# Patient Record
Sex: Male | Born: 1950
Health system: Southern US, Community
[De-identification: ages and names within clinical notes are randomized; demographics above are authoritative.]

## PROBLEM LIST (undated history)

## (undated) DIAGNOSIS — M1990 Unspecified osteoarthritis, unspecified site: Secondary | ICD-10-CM

## (undated) DIAGNOSIS — Z860100 Personal history of colon polyps, unspecified: Secondary | ICD-10-CM

## (undated) DIAGNOSIS — Z87442 Personal history of urinary calculi: Secondary | ICD-10-CM

## (undated) DIAGNOSIS — D126 Benign neoplasm of colon, unspecified: Secondary | ICD-10-CM

## (undated) DIAGNOSIS — I739 Peripheral vascular disease, unspecified: Secondary | ICD-10-CM

## (undated) DIAGNOSIS — E039 Hypothyroidism, unspecified: Secondary | ICD-10-CM

## (undated) DIAGNOSIS — I10 Essential (primary) hypertension: Secondary | ICD-10-CM

## (undated) DIAGNOSIS — I63239 Cerebral infarction due to unspecified occlusion or stenosis of unspecified carotid arteries: Secondary | ICD-10-CM

## (undated) DIAGNOSIS — E079 Disorder of thyroid, unspecified: Secondary | ICD-10-CM

## (undated) DIAGNOSIS — I639 Cerebral infarction, unspecified: Secondary | ICD-10-CM

## (undated) DIAGNOSIS — T8859XA Other complications of anesthesia, initial encounter: Secondary | ICD-10-CM

## (undated) DIAGNOSIS — E785 Hyperlipidemia, unspecified: Secondary | ICD-10-CM

## (undated) DIAGNOSIS — K219 Gastro-esophageal reflux disease without esophagitis: Secondary | ICD-10-CM

## (undated) DIAGNOSIS — Z8371 Family history of colonic polyps: Secondary | ICD-10-CM

## (undated) DIAGNOSIS — Z83719 Family history of colon polyps, unspecified: Secondary | ICD-10-CM

## (undated) HISTORY — PX: COLONOSCOPY: SHX174

## (undated) HISTORY — DX: Family history of colon polyps, unspecified: Z83.719

## (undated) HISTORY — DX: Hyperlipidemia, unspecified: E78.5

## (undated) HISTORY — PX: CAROTID ENDARTERECTOMY: SUR193

## (undated) HISTORY — DX: Essential (primary) hypertension: I10

## (undated) HISTORY — PX: TONSILLECTOMY: SUR1361

## (undated) HISTORY — PX: EYE SURGERY: SHX253

## (undated) HISTORY — PX: APPENDECTOMY: SHX54

## (undated) HISTORY — DX: Disorder of thyroid, unspecified: E07.9

## (undated) HISTORY — PX: CATARACT EXTRACTION, BILATERAL: SHX1313

## (undated) HISTORY — DX: Personal history of colon polyps, unspecified: Z86.0100

## (undated) HISTORY — DX: Personal history of colonic polyps: Z86.010

## (undated) HISTORY — DX: Family history of colonic polyps: Z83.71

---

## 2006-11-07 ENCOUNTER — Ambulatory Visit: Payer: Self-pay | Admitting: Vascular Surgery

## 2006-11-09 ENCOUNTER — Ambulatory Visit: Payer: Self-pay | Admitting: Vascular Surgery

## 2006-11-09 ENCOUNTER — Encounter (INDEPENDENT_AMBULATORY_CARE_PROVIDER_SITE_OTHER): Payer: Self-pay | Admitting: *Deleted

## 2006-11-09 ENCOUNTER — Inpatient Hospital Stay (HOSPITAL_COMMUNITY): Admission: RE | Admit: 2006-11-09 | Discharge: 2006-11-10 | Payer: Self-pay | Admitting: Vascular Surgery

## 2006-11-21 ENCOUNTER — Ambulatory Visit: Payer: Self-pay | Admitting: Vascular Surgery

## 2007-06-09 ENCOUNTER — Ambulatory Visit: Payer: Self-pay | Admitting: Vascular Surgery

## 2007-12-08 ENCOUNTER — Ambulatory Visit: Payer: Self-pay | Admitting: Vascular Surgery

## 2011-01-19 NOTE — Procedures (Signed)
CAROTID DUPLEX EXAM   INDICATION:  Follow up known carotid artery disease.   HISTORY:  Diabetes:  No.  Cardiac:  Hypertension:  Yes.  Smoking:  Yes.  Previous Surgery:  Right carotid end arterectomy with Dacron patch by  Dr. Arbie Cookey on 11/10/06.  CV History:  Patient is complaining of transient right arm weakness and  numbness.  Amaurosis Fugax No, Paresthesias Yes, Hemiparesis Yes.                                       RIGHT             LEFT  Brachial systolic pressure:         124               120  Brachial Doppler waveforms:         Biphasic          Biphasic  Vertebral direction of flow:        Antegrade         Antegrade  DUPLEX VELOCITIES (cm/sec)  CCA peak systolic                   76                46  ECA peak systolic                   81                132  ICA peak systolic                   51                Occluded  ICA end diastolic                   23                Occluded  PLAQUE MORPHOLOGY:                  None              Soft  PLAQUE AMOUNT:                      None              Severe  PLAQUE LOCATION:                    None              Throughout the ICA   IMPRESSION:  1. Normal carotid duplex noted in the right internal carotid artery,      status post right carotid end arterectomy.  2. Occluded left internal carotid artery.  3. Bilateral antegrade vertebral flow.   Patient did not want to see the doctor, even though he was symptomatic.   ___________________________________________  Larina Earthly, M.D.   MG/MEDQ  D:  06/09/2007  T:  06/10/2007  Job:  [id number]

## 2011-01-19 NOTE — Procedures (Signed)
CAROTID DUPLEX EXAM   INDICATION:  Followup, carotid artery disease.   HISTORY:  Diabetes:  No.  Cardiac:  Enlarged ventricle.  Hypertension:  Yes.  Smoking:  Occasionally.  Previous Surgery:  Right CEA with DPA by Dr. Arbie Cookey, 11/10/06.  CV History:  Intermittent right arm and leg weakness and numbness.  Amaurosis Fugax No, Paresthesias Yes, Hemiparesis Yes  Known occluded left ICA.                                       RIGHT             LEFT  Brachial systolic pressure:         150               145  Brachial Doppler waveforms:         Biphasic          Biphasic  Vertebral direction of flow:        Antegrade         Antegrade  DUPLEX VELOCITIES (cm/sec)  CCA peak systolic                   99                71  ECA peak systolic                   243               219  ICA peak systolic                   94                Occluded  ICA end diastolic                   43                Occluded  PLAQUE MORPHOLOGY:                  None in ICA       Soft  PLAQUE AMOUNT:                      None in ICA       Severe  PLAQUE LOCATION:                    ECA               ICA   IMPRESSION:  1. No evidence of restenosis in right internal carotid artery, status      post carotid endarterectomy.  2. Known occluded left internal carotid artery.  3. Right external carotid artery stenosis.  4. Left external carotid artery velocity is elevated; however, may be      due to occluded left internal carotid artery.   ___________________________________________  Larina Earthly, M.D.   AS/MEDQ  D:  12/08/2007  T:  12/08/2007  Job:  [id number]

## 2011-01-22 NOTE — H&P (Signed)
NAME:  Harold Harris, Harold Harris NO.:  [id number]   MEDICAL RECORD NO.:  [id number]          PATIENT TYPE:  INP   LOCATION:  NA                           FACILITY:  [facility]   PHYSICIAN:  Larina Earthly, M.D.    DATE OF BIRTH:  [birth year]   DATE OF ADMISSION:  11/09/2006  DATE OF DISCHARGE:                              HISTORY & PHYSICAL   ADMISSION DIAGNOSIS:  Severe symptomatic right internal carotid artery  stenosis.   HISTORY OF PRESENT ILLNESS:  The patient is a 60 year old gentleman who  over the past several months has had several different events of  neurologic deficits.  He has had several episodes of right hand numbness  and clumsiness and two weeks ago had an episode of total right-sided  numbness and weakness making it impossible for him to stand, this lasted  for several minutes and completely resolved.  He also has had at least  one episode of very clear-cut right eye amaurosis fugax approximately  one month ago.  He has had no neurological deficits for the past three  weeks.  He also has had occasional headache associated with this.  He  currently is not having any neurologic symptoms and is back at his  baseline.   PAST MEDICAL HISTORY:  Negative for cardiac disease, negative for  hypertension, he has recently been diagnosed with elevated cholesterol.  He does not have any history of premature atherosclerotic disease in his  family.  He is a married father of one, works as a Curator.  He does  smoke one pack of cigarettes per day and does not drink alcohol on a  regular basis.   REVIEW OF SYSTEMS:  Positive only for the neurologic events discussed  above.   ALLERGIES:  No known drug allergies.   MEDICATIONS:  Aspirin 325 mg a day which has been started over the past  two months with these symptoms.   PHYSICAL EXAMINATION:  GENERAL:  He is a well-developed, well-nourished  white male appearing stated age of 4.  VITALS:  Blood pressure is 138/80 in his  left arm, 140/80 in his right  arm, pulse 72, respirations 18.  CAROTIDS:  His carotid arteries reveal a harsh right carotid bruit and a  soft left carotid bruit.  NEURO:  He is grossly intact neurologically.  EXTREMITIES:  His radial and femoral pulses  are 2+ bilaterally.  He has  2+ left posterior tibial pulse and 1+ right dorsalis pedis pulse.  ABDOMINAL EXAM:  Reveals no aneurysm palpable.  HEART:  Regular rate and rhythm.  CHEST:  Clear.   LABORATORY DATA:  He did have a carotid duplex evaluation revealing  occlusion of his left internal carotid artery and severe stenosis in his  right internal carotid artery.   IMPRESSION:  Severe extracranial cerebrovascular occlusive disease.   PLAN:  The patient will be admitted on March 5 for a right carotid  endarterectomy for reduction of stroke risk.  I discussed with Mr.  Harris that his complete occlusion of his left carotid was not fixable,  but that he does have  adequate collateral cross field from right to left.  I explained the  procedure including the 1 to 2% risk of stroke with surgery and possible  low risk for cranial nerve injury, he understands and will proceed with  right carotid endarterectomy on March 5.      Larina Earthly, M.D.  Electronically Signed     TFE/MEDQ  D:  11/07/2006  T:  11/07/2006  Job:  [id number]

## 2011-01-22 NOTE — Op Note (Signed)
NAME:  Harold Harris, Harold Harris NO.:  [id number]   MEDICAL RECORD NO.:  [id number]          PATIENT TYPE:  INP   LOCATION:  2550                         FACILITY:  [facility]   PHYSICIAN:  Larina Earthly, M.D.    DATE OF BIRTH:  April 12, 1951   DATE OF PROCEDURE:  11/09/2006  DATE OF DISCHARGE:                               OPERATIVE REPORT   PREOPERATIVE DIAGNOSIS:  Severe symptomatic right internal carotid  artery stenosis.   POSTOPERATIVE DIAGNOSIS:  Severe symptomatic right internal carotid  artery stenosis.   PROCEDURE:  Right carotid endarterectomy and Dacron patch angioplasty.   SURGEON:  Dr. Tawanna Cooler Early.   ASSISTANT:  Dr. Jerilee Field and Gershon Crane, P.A.-C.   ANESTHESIA:  General endotracheal.   COMPLICATIONS:  None.   DISPOSITION:  To recovery room, neurologically intact.   PROCEDURE IN DETAIL:  The patient was taken to the operating room and  placed in the supine position, where the area of the right neck was  prepped and draped in the usual sterile fashion.  An incision was made  in the anterior sternocleidomastoid and carried down to the platysma  with electrocautery.  The sternocleidomastoid was reflected posteriorly  and the carotid sheath was opened.  The fascial vein was ligated with 3-  0 silk ties and divided.  The common carotid artery was encircled with  umbilical tape Rumel tourniquet.  The vagus nerve was identified and  preserved.  Dissection was carried onto the bifurcation.  The superior  thyroid artery was encircled with a 2-0 silk Potts tie, and the external  carotid was encircled with a red vessel loop, and the internal carotid  artery was circled with umbilical tape and Rumel tourniquet.  The  hypoglossal nerve was identified and preserved.  The patient was given  8,000 units for intravenous heparin effect.  Circulation time of the  internal, external and common arteries were occluded.  The common  carotid artery was opened with an 11 blade  and extended longitudinally  with Potts scissors through the plaque onto the internal carotid.  A 10  shunt was passed up into the internal carotid, allowed to back bleed and  down the common carotid artery and the area is secured with Rumel  tourniquets.  Then endarterectomy is begun on the common carotid artery  and the plaque was divided proximally with Potts scissors.  The  endarterectomy was carried on to the bifurcation.  The external carotid  was endarterectomized with the eversion technique and the internal  carotid was endarterectomized in an open fashion.  The remaining  atheromatous debris was removed from the endarterectomy plane.  A  Finesse Hemashield Dacron patch was brought into the field and was sewn  as a patch angioplasty with a running 6-0 Prolene suture.  Prior to  completion of the anastomosis, the usual flushing maneuvers were  undertaken and anastomosis was then completed.  Flow was restored first  to the external and then the internal carotid artery.  Excellent flow  characteristics were noted with handheld Doppler in the internal and  external carotid arteries.  The patient was given 50 mg of protamine to  reverse the heparin.  The wounds were irrigated with saline.  Hemostasis  seen at the cautery wounds were closed with several 3-0 Vicryl sutures  to reapproximate the  sternocleidomastoid over the carotid sheath.  Next the platysma was  closed with running 3-0 Vicryl suture and finally the skin was closed  with a 4-0 subcuticular Vicryl stitch.  A sterile dressing was applied  and the patient was awakened, neurologically intact in the operating  room, and was transferred to the recovery room in stable condition.      Larina Earthly, M.D.  Electronically Signed     TFE/MEDQ  D:  11/09/2006  T:  11/09/2006  Job:  [id number]

## 2011-01-22 NOTE — Discharge Summary (Signed)
NAME:  Harold Harris, Harold Harris NO.:  [id number]   MEDICAL RECORD NO.:  [id number]          PATIENT TYPE:  INP   LOCATION:  3311                         FACILITY:  [facility]   PHYSICIAN:  Rowe Clack, P.A.-C. DATE OF BIRTH:  31-Dec-1950   DATE OF ADMISSION:  11/09/2006  DATE OF DISCHARGE:  11/10/2006                               DISCHARGE SUMMARY   HISTORY OF PRESENT ILLNESS:  The patient is a 60 year old gentleman who  over the past several months has had several different events of  neurological deficits.  He has had several episodes of right hand  numbness and clumsiness, and two weeks ago he had an episode of a total  right-sided numbness and weakness making it impossible for him to stand.  This lasted for several minutes and then completely resolved.  He also  had at least one episode of very clear-cut right eye amaurosis fugax  approximately one month ago.  He has had no neurological deficits for  the past three weeks.  He does have occasional headaches associated with  this.  He was seen in Vascular Surgery consultation by Dr. Arbie Cookey.  A  carotid Duplex evaluation revealed occlusion of his left internal  carotid and severe stenosis of the right internal carotid artery.  Due  to these findings, he is felt to be a candidate for right carotid  endarterectomy so as to lower his risk of cerebrovascular accident.  He  was admitted this hospitalization for that procedure.   PAST MEDICAL HISTORY:  1. Hypercholesterolemia.  2. Tobacco abuse.   REVIEW OF SYMPTOMS:  Positive only for the neurological events discussed  above.   ALLERGIES:  No known drug allergies.   MEDICATIONS PRIOR TO ADMISSION:  Aspirin 325 mg daily stated two months  ago when he began to have these symptoms.   Family history, social history, review of symptoms, and physical  examination, please seen the history and physical done at the time of  admission.   HOSPITAL COURSE:  The patient was admitted  electively and on November 09, 2006 taken to the operating room at which time he underwent a right  carotid endarterectomy with a Dacron patch angioplasty.  The patient  tolerated the procedure well and was taken to the Postanesthesia Care  Unit in stable condition.   POSTOPERATIVE HOSPITAL COURSE:  The patient has overall done well.  He  did have some chest pain with hypotension requiring Dopamine drip.  A 12-  lead EKG was unremarkable for ischemic changes.  He had some cardiac  enzymes that were obtained that were also negative.  The Dopamine was  able to be weaned, and his blood pressure has shown a stable improvement  and now currently shows a systolic in the 120's.  His other laboratory  values are stable.  Hemoglobin and hematocrit postoperative day one is  14 and 41, respectively.  His incision is healing well without evidence  of hematoma, drainage, or infection.  His overall status is felt to be  stable for discharge on November 10, 2006.   MEDICATIONS ON DISCHARGE:  1.  Aspirin 325 mg p.o. daily for pain.  2. Tylox 1 to 2 every 6 hours as needed.   INSTRUCTIONS:  The patient will receive written instructions regarding  medications, activity, diet, wound care, and followup.  Followup will  include Dr. Arbie Cookey in two weeks.  The office will call with his  appointment.   FINAL DIAGNOSIS:  Severe extracranial cerebrovascular occlusive disease  as described above, now status post right carotid endarterectomy.   Other diagnoses as listed per the history.      Rowe Clack, P.A.-C.     Sherryll Burger  D:  11/10/2006  T:  11/11/2006  Job:  [id number]   cc:   Larina Earthly, M.D.

## 2015-05-22 DIAGNOSIS — M79671 Pain in right foot: Secondary | ICD-10-CM | POA: Diagnosis not present

## 2015-05-22 DIAGNOSIS — I739 Peripheral vascular disease, unspecified: Secondary | ICD-10-CM | POA: Diagnosis not present

## 2015-05-22 DIAGNOSIS — I10 Essential (primary) hypertension: Secondary | ICD-10-CM | POA: Diagnosis not present

## 2015-05-22 DIAGNOSIS — Z0000 Encounter for general adult medical examination without abnormal findings: Secondary | ICD-10-CM | POA: Diagnosis not present

## 2015-05-22 DIAGNOSIS — E039 Hypothyroidism, unspecified: Secondary | ICD-10-CM | POA: Diagnosis not present

## 2015-05-22 DIAGNOSIS — Z1211 Encounter for screening for malignant neoplasm of colon: Secondary | ICD-10-CM | POA: Diagnosis not present

## 2015-05-22 DIAGNOSIS — Z79899 Other long term (current) drug therapy: Secondary | ICD-10-CM | POA: Diagnosis not present

## 2015-05-22 DIAGNOSIS — E782 Mixed hyperlipidemia: Secondary | ICD-10-CM | POA: Diagnosis not present

## 2015-05-26 DIAGNOSIS — Z1211 Encounter for screening for malignant neoplasm of colon: Secondary | ICD-10-CM | POA: Diagnosis not present

## 2015-06-03 DIAGNOSIS — M79671 Pain in right foot: Secondary | ICD-10-CM | POA: Diagnosis not present

## 2015-06-03 DIAGNOSIS — D2371 Other benign neoplasm of skin of right lower limb, including hip: Secondary | ICD-10-CM | POA: Diagnosis not present

## 2015-06-04 DIAGNOSIS — R195 Other fecal abnormalities: Secondary | ICD-10-CM | POA: Diagnosis not present

## 2015-06-24 DIAGNOSIS — M79671 Pain in right foot: Secondary | ICD-10-CM | POA: Diagnosis not present

## 2015-06-24 DIAGNOSIS — D2371 Other benign neoplasm of skin of right lower limb, including hip: Secondary | ICD-10-CM | POA: Diagnosis not present

## 2015-08-14 DIAGNOSIS — R195 Other fecal abnormalities: Secondary | ICD-10-CM | POA: Diagnosis not present

## 2015-08-15 DIAGNOSIS — E039 Hypothyroidism, unspecified: Secondary | ICD-10-CM | POA: Diagnosis not present

## 2015-08-15 DIAGNOSIS — Z79899 Other long term (current) drug therapy: Secondary | ICD-10-CM | POA: Diagnosis not present

## 2015-08-15 DIAGNOSIS — E782 Mixed hyperlipidemia: Secondary | ICD-10-CM | POA: Diagnosis not present

## 2015-08-15 DIAGNOSIS — I10 Essential (primary) hypertension: Secondary | ICD-10-CM | POA: Diagnosis not present

## 2015-08-22 DIAGNOSIS — Z125 Encounter for screening for malignant neoplasm of prostate: Secondary | ICD-10-CM | POA: Diagnosis not present

## 2015-08-22 DIAGNOSIS — Z79899 Other long term (current) drug therapy: Secondary | ICD-10-CM | POA: Diagnosis not present

## 2015-08-22 DIAGNOSIS — E039 Hypothyroidism, unspecified: Secondary | ICD-10-CM | POA: Diagnosis not present

## 2015-08-22 DIAGNOSIS — I10 Essential (primary) hypertension: Secondary | ICD-10-CM | POA: Diagnosis not present

## 2015-08-22 DIAGNOSIS — I739 Peripheral vascular disease, unspecified: Secondary | ICD-10-CM | POA: Diagnosis not present

## 2015-08-22 DIAGNOSIS — M5137 Other intervertebral disc degeneration, lumbosacral region: Secondary | ICD-10-CM | POA: Diagnosis not present

## 2015-08-22 DIAGNOSIS — R011 Cardiac murmur, unspecified: Secondary | ICD-10-CM | POA: Diagnosis not present

## 2015-08-22 DIAGNOSIS — E782 Mixed hyperlipidemia: Secondary | ICD-10-CM | POA: Diagnosis not present

## 2015-09-05 DIAGNOSIS — R011 Cardiac murmur, unspecified: Secondary | ICD-10-CM | POA: Diagnosis not present

## 2015-09-18 DIAGNOSIS — D125 Benign neoplasm of sigmoid colon: Secondary | ICD-10-CM | POA: Diagnosis not present

## 2015-09-18 DIAGNOSIS — R195 Other fecal abnormalities: Secondary | ICD-10-CM | POA: Diagnosis not present

## 2015-09-18 DIAGNOSIS — D123 Benign neoplasm of transverse colon: Secondary | ICD-10-CM | POA: Diagnosis not present

## 2015-09-18 DIAGNOSIS — D122 Benign neoplasm of ascending colon: Secondary | ICD-10-CM | POA: Diagnosis not present

## 2015-09-18 DIAGNOSIS — K5730 Diverticulosis of large intestine without perforation or abscess without bleeding: Secondary | ICD-10-CM | POA: Diagnosis not present

## 2015-09-18 DIAGNOSIS — K644 Residual hemorrhoidal skin tags: Secondary | ICD-10-CM | POA: Diagnosis not present

## 2015-09-18 DIAGNOSIS — K648 Other hemorrhoids: Secondary | ICD-10-CM | POA: Diagnosis not present

## 2015-09-18 DIAGNOSIS — D126 Benign neoplasm of colon, unspecified: Secondary | ICD-10-CM | POA: Diagnosis not present

## 2015-09-18 DIAGNOSIS — L89890 Pressure ulcer of other site, unstageable: Secondary | ICD-10-CM | POA: Diagnosis not present

## 2015-09-18 DIAGNOSIS — D124 Benign neoplasm of descending colon: Secondary | ICD-10-CM | POA: Diagnosis not present

## 2015-09-18 DIAGNOSIS — K640 First degree hemorrhoids: Secondary | ICD-10-CM | POA: Diagnosis not present

## 2015-12-17 DIAGNOSIS — M25512 Pain in left shoulder: Secondary | ICD-10-CM | POA: Diagnosis not present

## 2016-01-05 DIAGNOSIS — M7522 Bicipital tendinitis, left shoulder: Secondary | ICD-10-CM | POA: Diagnosis not present

## 2016-02-09 DIAGNOSIS — Z79899 Other long term (current) drug therapy: Secondary | ICD-10-CM | POA: Diagnosis not present

## 2016-02-09 DIAGNOSIS — E039 Hypothyroidism, unspecified: Secondary | ICD-10-CM | POA: Diagnosis not present

## 2016-02-09 DIAGNOSIS — E782 Mixed hyperlipidemia: Secondary | ICD-10-CM | POA: Diagnosis not present

## 2016-02-09 DIAGNOSIS — Z125 Encounter for screening for malignant neoplasm of prostate: Secondary | ICD-10-CM | POA: Diagnosis not present

## 2016-02-09 DIAGNOSIS — I10 Essential (primary) hypertension: Secondary | ICD-10-CM | POA: Diagnosis not present

## 2016-02-16 DIAGNOSIS — M5442 Lumbago with sciatica, left side: Secondary | ICD-10-CM | POA: Diagnosis not present

## 2016-02-16 DIAGNOSIS — I739 Peripheral vascular disease, unspecified: Secondary | ICD-10-CM | POA: Diagnosis not present

## 2016-02-16 DIAGNOSIS — I10 Essential (primary) hypertension: Secondary | ICD-10-CM | POA: Diagnosis not present

## 2016-02-16 DIAGNOSIS — D696 Thrombocytopenia, unspecified: Secondary | ICD-10-CM | POA: Diagnosis not present

## 2016-02-16 DIAGNOSIS — E039 Hypothyroidism, unspecified: Secondary | ICD-10-CM | POA: Diagnosis not present

## 2016-02-16 DIAGNOSIS — E782 Mixed hyperlipidemia: Secondary | ICD-10-CM | POA: Diagnosis not present

## 2016-02-16 DIAGNOSIS — W57XXXA Bitten or stung by nonvenomous insect and other nonvenomous arthropods, initial encounter: Secondary | ICD-10-CM | POA: Diagnosis not present

## 2016-05-20 DIAGNOSIS — D696 Thrombocytopenia, unspecified: Secondary | ICD-10-CM | POA: Diagnosis not present

## 2016-08-17 DIAGNOSIS — R252 Cramp and spasm: Secondary | ICD-10-CM | POA: Diagnosis not present

## 2016-08-17 DIAGNOSIS — E039 Hypothyroidism, unspecified: Secondary | ICD-10-CM | POA: Diagnosis not present

## 2016-08-17 DIAGNOSIS — I10 Essential (primary) hypertension: Secondary | ICD-10-CM | POA: Diagnosis not present

## 2016-08-17 DIAGNOSIS — Z0000 Encounter for general adult medical examination without abnormal findings: Secondary | ICD-10-CM | POA: Diagnosis not present

## 2016-08-17 DIAGNOSIS — E782 Mixed hyperlipidemia: Secondary | ICD-10-CM | POA: Diagnosis not present

## 2016-08-17 DIAGNOSIS — Z79899 Other long term (current) drug therapy: Secondary | ICD-10-CM | POA: Diagnosis not present

## 2016-08-17 DIAGNOSIS — N529 Male erectile dysfunction, unspecified: Secondary | ICD-10-CM | POA: Diagnosis not present

## 2017-02-15 DIAGNOSIS — Z125 Encounter for screening for malignant neoplasm of prostate: Secondary | ICD-10-CM | POA: Diagnosis not present

## 2017-02-15 DIAGNOSIS — E782 Mixed hyperlipidemia: Secondary | ICD-10-CM | POA: Diagnosis not present

## 2017-02-15 DIAGNOSIS — E039 Hypothyroidism, unspecified: Secondary | ICD-10-CM | POA: Diagnosis not present

## 2017-02-15 DIAGNOSIS — I739 Peripheral vascular disease, unspecified: Secondary | ICD-10-CM | POA: Diagnosis not present

## 2017-02-15 DIAGNOSIS — Z79899 Other long term (current) drug therapy: Secondary | ICD-10-CM | POA: Diagnosis not present

## 2017-02-15 DIAGNOSIS — I10 Essential (primary) hypertension: Secondary | ICD-10-CM | POA: Diagnosis not present

## 2017-02-22 DIAGNOSIS — Z961 Presence of intraocular lens: Secondary | ICD-10-CM | POA: Diagnosis not present

## 2017-02-25 DIAGNOSIS — I10 Essential (primary) hypertension: Secondary | ICD-10-CM | POA: Diagnosis not present

## 2017-02-25 DIAGNOSIS — E782 Mixed hyperlipidemia: Secondary | ICD-10-CM | POA: Diagnosis not present

## 2017-02-25 DIAGNOSIS — Z79899 Other long term (current) drug therapy: Secondary | ICD-10-CM | POA: Diagnosis not present

## 2017-02-25 DIAGNOSIS — E039 Hypothyroidism, unspecified: Secondary | ICD-10-CM | POA: Diagnosis not present

## 2017-02-25 DIAGNOSIS — Z125 Encounter for screening for malignant neoplasm of prostate: Secondary | ICD-10-CM | POA: Diagnosis not present

## 2017-03-21 DIAGNOSIS — I739 Peripheral vascular disease, unspecified: Secondary | ICD-10-CM | POA: Diagnosis not present

## 2017-03-21 DIAGNOSIS — R42 Dizziness and giddiness: Secondary | ICD-10-CM | POA: Diagnosis not present

## 2017-04-26 ENCOUNTER — Ambulatory Visit (INDEPENDENT_AMBULATORY_CARE_PROVIDER_SITE_OTHER): Payer: Medicare HMO | Admitting: Vascular Surgery

## 2017-04-26 ENCOUNTER — Encounter (INDEPENDENT_AMBULATORY_CARE_PROVIDER_SITE_OTHER): Payer: Self-pay | Admitting: Vascular Surgery

## 2017-04-26 VITALS — BP 146/82 | HR 49 | Resp 17 | Ht 68.0 in | Wt 198.8 lb

## 2017-04-26 DIAGNOSIS — E785 Hyperlipidemia, unspecified: Secondary | ICD-10-CM

## 2017-04-26 DIAGNOSIS — I10 Essential (primary) hypertension: Secondary | ICD-10-CM | POA: Insufficient documentation

## 2017-04-26 DIAGNOSIS — I6523 Occlusion and stenosis of bilateral carotid arteries: Secondary | ICD-10-CM

## 2017-04-26 DIAGNOSIS — I6529 Occlusion and stenosis of unspecified carotid artery: Secondary | ICD-10-CM | POA: Insufficient documentation

## 2017-04-26 NOTE — Patient Instructions (Signed)
Carotid Artery Disease The carotid arteries are arteries on both sides of the neck. They carry blood to the brain. Carotid artery disease is when the arteries get smaller (narrow) or get blocked. If these arteries get smaller or get blocked, you are more likely to have a stroke or warning stroke (transient ischemic attack). Follow these instructions at home:  Take medicines as told by your doctor. Make sure you understand all your medicine instructions. Do not stop your medicines without talking to your doctor first.  Follow your doctor's diet instructions. It is important to eat a healthy diet that includes plenty of: ? Fresh fruits. ? Vegetables. ? Lean meats.  Avoid: ? High-fat foods. ? High-sodium foods. ? Foods that are fried, overly processed, or have poor nutritional value.  Stay a healthy weight.  Stay active. Get at least 30 minutes of activity every day.  Do not smoke.  Limit alcohol use to: ? No more than 2 drinks a day for men. ? No more than 1 drink a day for women who are not pregnant.  Do not use illegal drugs.  Keep all doctor visits as told. Get help right away if:  You have sudden weakness or loss of feeling (numbness) on one side of the body, such as the face, arm, or leg.  You have sudden confusion.  You have trouble speaking (aphasia) or understanding.  You have sudden trouble seeing out of one or both eyes.  You have sudden trouble walking.  You have dizziness or feel like you might pass out (faint).  You have a loss of balance or your movements are not steady (uncoordinated).  You have a sudden, severe headache with no known cause.  You have trouble swallowing (dysphagia). Call your local emergency services (911 in [place] Do notdrive yourself to the clinic or hospital. This information is not intended to replace advice given to you by your health care provider. Make sure you discuss any questions you have with your health care  provider. Document Released: 08/09/2012 Document Revised: 01/29/2016 Document Reviewed: 02/21/2013 Elsevier Interactive Patient Education  2018 [employer]  

## 2017-04-26 NOTE — Assessment & Plan Note (Signed)
lipid control important in reducing the progression of atherosclerotic disease. Continue statin therapy  

## 2017-04-26 NOTE — Progress Notes (Signed)
Patient ID: Harold Harris, male   DOB: Aug 15, 1951, 66 y.o.   MRN: [id number]  Chief Complaint  Patient presents with  . New Patient (Initial Visit)    carotid stenosis    HPI Harold Harris is a 66 y.o. male.  I am asked to see the patient by Dr. Doy Hutching for evaluation of carotid stenosis.  The patient reports 10 years ago having a right carotid endarterectomy done in [place]. He was told at that time his left carotid artery was completely blocked and nothing to be done for it. He had not had his carotids checked and what sounds like a couple of years. He started seeing Dr. Doy Hutching recently and had a carotid duplex performed. This demonstrated an occlusion of the left carotid artery and a patent right carotid endarterectomy. He denies any recent focal neurologic symptoms. He has some occasional dizziness with head position and strenuous activity but this is not severe. He denies any arm or leg weakness or numbness, speech or swallowing difficulty, or temporary monocular blindness   Past Medical History:  Diagnosis Date  . Hyperlipidemia   . Hypertension   . Thyroid disease     Past Surgical History:  Procedure Laterality Date  . APPENDECTOMY    . CAROTID ENDARTERECTOMY    . CATARACT EXTRACTION, BILATERAL    . COLONOSCOPY    . 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 No tobacco use, alcohol abuse, or IV drug use  No Known Allergies  Current Outpatient Prescriptions  Medication Sig Dispense Refill  . acetaminophen (TYLENOL) 650 MG CR tablet Take by mouth.    Marland Kitchen aspirin EC 81 MG tablet Take by mouth.    . Cholecalciferol (VITAMIN D3) 1000 units CAPS Take by mouth.    . Cranberry-Vitamin C-Vitamin E 4200-20-3 MG-MG-UNIT CAPS Take by mouth.    . Ginkgo Biloba 40 MG TABS Take by mouth.    . levothyroxine (SYNTHROID, LEVOTHROID) 75 MCG tablet TAKE 1 TABLET ONE  TIME DAILY ON AN EMPTY STOMACH WITH A GLASS OF WATER AT LEAST 30 TO 60 MINUTES BEFORE BREAKFAST    . losartan (COZAAR) 25 MG tablet TAKE 1 TABLET EVERY DAY    . polyethylene glycol-electrolytes (GAVILYTE-N WITH FLAVOR PACK) 420 g solution TAKE 4,000 ML ONE TIME FOR 1 DOSE.    . rosuvastatin (CRESTOR) 20 MG tablet Take by mouth.    . sildenafil (REVATIO) 20 MG tablet 2-5 tablets daily as needed     No current facility-administered medications for this visit.       REVIEW OF SYSTEMS (Negative unless checked)  Constitutional: [] Weight loss  [] Fever  [] Chills Cardiac: [] Chest pain   [] Chest pressure   [] Palpitations   [] Shortness of breath when laying flat   [] Shortness of breath at rest   [] Shortness of breath with exertion. Vascular:  [] Pain in legs with walking   [] Pain in legs at rest   [] Pain in legs when laying flat   [] Claudication   [] Pain in feet when walking  [] Pain in feet at rest  [] Pain in feet when laying flat   [] History of DVT   [] Phlebitis   [] Swelling in legs   [] Varicose veins   [] Non-healing ulcers Pulmonary:   [] Uses home oxygen   [] Productive cough   [] Hemoptysis   [] Wheeze  [] COPD   [] Asthma Neurologic:  [x] Dizziness  [] Blackouts   [] Seizures   []   History of stroke   [] History of TIA  [] Aphasia   [] Temporary blindness   [] Dysphagia   [] Weakness or numbness in arms   [] Weakness or numbness in legs Musculoskeletal:  [] Arthritis   [] Joint swelling   [] Joint pain   [] Low back pain Hematologic:  [] Easy bruising  [] Easy bleeding   [] Hypercoagulable state   [] Anemic  [] Hepatitis Gastrointestinal:  [] Blood in stool   [] Vomiting blood  [] Gastroesophageal reflux/heartburn   [] Abdominal pain Genitourinary:  [] Chronic kidney disease   [] Difficult urination  [] Frequent urination  [] Burning with urination   [] Hematuria Skin:  [] Rashes   [] Ulcers   [] Wounds Psychological:  [] History of anxiety   []  History of major depression.    Physical Exam BP (!) 146/82   Pulse (!) 49   Resp 17    Ht 5\' 8"  (1.727 m)   Wt 198 lb 12.8 oz (90.2 kg)   BMI 30.23 kg/m  Gen:  WD/WN, NAD. Appears younger than stated age Head: [place]/AT, No temporalis wasting.  Ear/Nose/Throat: Hearing grossly intact, nares w/o erythema or drainage, oropharynx w/o Erythema/Exudate Eyes: Conjunctiva clear, sclera non-icteric  Neck: trachea midline.  No JVD. Soft left carotid bruit Pulmonary:  Good air movement, clear to auscultation bilaterally.  Cardiac: RRR, normal S1, S2, no Murmurs, rubs or gallops. Vascular:  Vessel Right Left  Radial Palpable Palpable                                   Gastrointestinal: soft, non-tender/non-distended. Musculoskeletal: M/S 5/5 throughout.  Extremities without ischemic changes.  No deformity or atrophy. No edema. Neurologic: Sensation grossly intact in extremities.  Symmetrical.  Speech is fluent. Motor exam as listed above. Psychiatric: Judgment intact, Mood & affect appropriate for pt's clinical situation. Dermatologic: No rashes or ulcers noted.  No cellulitis or open wounds.    Radiology No results found.  Labs No results found for this or any previous visit (from the past 2160 hour(s)).  Assessment/Plan:  Hypertension blood pressure control important in reducing the progression of atherosclerotic disease. On appropriate oral medications.   Hyperlipidemia lipid control important in reducing the progression of atherosclerotic disease. Continue statin therapy   Carotid stenosis Recently had a carotid duplex performed. This demonstrated an occlusion of the left carotid artery and a patent right carotid endarterectomy. Continue aspirin and Crestor. No role for intervention at this point. Would recommend annual surveillance duplex for follow-up. Contact our office if any problems in the interim.      Leotis Pain 04/26/2017, 9:54 AM   This note was created with Dragon medical transcription system.  Any errors from dictation are unintentional.

## 2017-04-26 NOTE — Assessment & Plan Note (Signed)
Recently had a carotid duplex performed. This demonstrated an occlusion of the left carotid artery and a patent right carotid endarterectomy. Continue aspirin and Crestor. No role for intervention at this point. Would recommend annual surveillance duplex for follow-up. Contact our office if any problems in the interim.

## 2017-04-26 NOTE — Assessment & Plan Note (Signed)
blood pressure control important in reducing the progression of atherosclerotic disease. On appropriate oral medications.  

## 2017-04-27 ENCOUNTER — Other Ambulatory Visit (INDEPENDENT_AMBULATORY_CARE_PROVIDER_SITE_OTHER): Payer: Self-pay | Admitting: Vascular Surgery

## 2017-06-27 DIAGNOSIS — L239 Allergic contact dermatitis, unspecified cause: Secondary | ICD-10-CM | POA: Diagnosis not present

## 2017-08-18 DIAGNOSIS — I739 Peripheral vascular disease, unspecified: Secondary | ICD-10-CM | POA: Diagnosis not present

## 2017-08-18 DIAGNOSIS — E039 Hypothyroidism, unspecified: Secondary | ICD-10-CM | POA: Diagnosis not present

## 2017-08-18 DIAGNOSIS — I10 Essential (primary) hypertension: Secondary | ICD-10-CM | POA: Diagnosis not present

## 2017-08-18 DIAGNOSIS — J449 Chronic obstructive pulmonary disease, unspecified: Secondary | ICD-10-CM | POA: Diagnosis not present

## 2017-08-18 DIAGNOSIS — Z0000 Encounter for general adult medical examination without abnormal findings: Secondary | ICD-10-CM | POA: Diagnosis not present

## 2017-08-18 DIAGNOSIS — E782 Mixed hyperlipidemia: Secondary | ICD-10-CM | POA: Diagnosis not present

## 2017-08-18 DIAGNOSIS — Z79899 Other long term (current) drug therapy: Secondary | ICD-10-CM | POA: Diagnosis not present

## 2017-08-18 DIAGNOSIS — J431 Panlobular emphysema: Secondary | ICD-10-CM | POA: Diagnosis not present

## 2017-08-25 DIAGNOSIS — E039 Hypothyroidism, unspecified: Secondary | ICD-10-CM | POA: Diagnosis not present

## 2017-08-25 DIAGNOSIS — I10 Essential (primary) hypertension: Secondary | ICD-10-CM | POA: Diagnosis not present

## 2017-08-25 DIAGNOSIS — Z79899 Other long term (current) drug therapy: Secondary | ICD-10-CM | POA: Diagnosis not present

## 2017-08-25 DIAGNOSIS — E782 Mixed hyperlipidemia: Secondary | ICD-10-CM | POA: Diagnosis not present

## 2018-02-23 DIAGNOSIS — I10 Essential (primary) hypertension: Secondary | ICD-10-CM | POA: Diagnosis not present

## 2018-02-23 DIAGNOSIS — D126 Benign neoplasm of colon, unspecified: Secondary | ICD-10-CM | POA: Diagnosis not present

## 2018-02-23 DIAGNOSIS — Z125 Encounter for screening for malignant neoplasm of prostate: Secondary | ICD-10-CM | POA: Diagnosis not present

## 2018-02-23 DIAGNOSIS — E782 Mixed hyperlipidemia: Secondary | ICD-10-CM | POA: Diagnosis not present

## 2018-02-23 DIAGNOSIS — N529 Male erectile dysfunction, unspecified: Secondary | ICD-10-CM | POA: Diagnosis not present

## 2018-02-23 DIAGNOSIS — E039 Hypothyroidism, unspecified: Secondary | ICD-10-CM | POA: Diagnosis not present

## 2018-02-23 DIAGNOSIS — Z79899 Other long term (current) drug therapy: Secondary | ICD-10-CM | POA: Diagnosis not present

## 2018-04-18 DIAGNOSIS — Z86010 Personal history of colonic polyps: Secondary | ICD-10-CM | POA: Diagnosis not present

## 2018-04-28 ENCOUNTER — Encounter (INDEPENDENT_AMBULATORY_CARE_PROVIDER_SITE_OTHER): Payer: Self-pay

## 2018-04-28 ENCOUNTER — Encounter (INDEPENDENT_AMBULATORY_CARE_PROVIDER_SITE_OTHER): Payer: Medicare HMO

## 2018-04-28 ENCOUNTER — Ambulatory Visit (INDEPENDENT_AMBULATORY_CARE_PROVIDER_SITE_OTHER): Payer: Medicare HMO | Admitting: Vascular Surgery

## 2018-05-31 ENCOUNTER — Ambulatory Visit (INDEPENDENT_AMBULATORY_CARE_PROVIDER_SITE_OTHER): Payer: Medicare HMO

## 2018-05-31 ENCOUNTER — Ambulatory Visit (INDEPENDENT_AMBULATORY_CARE_PROVIDER_SITE_OTHER): Payer: Medicare HMO | Admitting: Nurse Practitioner

## 2018-05-31 ENCOUNTER — Encounter (INDEPENDENT_AMBULATORY_CARE_PROVIDER_SITE_OTHER): Payer: Self-pay | Admitting: Nurse Practitioner

## 2018-05-31 VITALS — BP 139/71 | HR 48 | Resp 16 | Ht 68.0 in | Wt 197.4 lb

## 2018-05-31 DIAGNOSIS — Z7982 Long term (current) use of aspirin: Secondary | ICD-10-CM | POA: Diagnosis not present

## 2018-05-31 DIAGNOSIS — I10 Essential (primary) hypertension: Secondary | ICD-10-CM

## 2018-05-31 DIAGNOSIS — E785 Hyperlipidemia, unspecified: Secondary | ICD-10-CM

## 2018-05-31 DIAGNOSIS — Z87891 Personal history of nicotine dependence: Secondary | ICD-10-CM

## 2018-05-31 DIAGNOSIS — I6523 Occlusion and stenosis of bilateral carotid arteries: Secondary | ICD-10-CM | POA: Diagnosis not present

## 2018-05-31 NOTE — Progress Notes (Signed)
Subjective:    Patient ID: Harold Harris, male    DOB: 01-02-1951, 67 y.o.   MRN: [id number] Chief Complaint  Patient presents with  . Carotid    73yr follow up    HPI  Harold Harris is a 67 y.o. male  seen for follow up evaluation of carotid stenosis. The carotid stenosis followed by ultrasound.  Harold Harris had a right carotid endarterectomy approximately 2 years ago.  He has a known left carotid occlusion.  There have been no interval changes of the patient's health since last visit.  The patient denies amaurosis fugax. There is no recent history of TIA symptoms or focal motor deficits. There is no prior documented CVA.  The patient is taking enteric-coated aspirin 81 mg daily.  There is no history of migraine headaches. There is no history of seizures.  The patient has a history of coronary artery disease, no recent episodes of angina or shortness of breath. The patient denies PAD or claudication symptoms. There is a history of hyperlipidemia which is being treated with a statin.    Carotid Duplex done today shows less than 50% stenosis of the right carotid artery.  Total occlusion of the left internal carotid artery.  Bilateral vertebral arteries demonstrate antegrade flow.    Review of Systems   Review of Systems: Negative Unless Checked Constitutional: [] Weight loss  [] Fever  [] Chills Cardiac: [] Chest pain   ? Atrial Fibrillation  [] Palpitations   [] Shortness of breath when laying flat   [] Shortness of breath with exertion. Vascular:  [] Pain in legs with walking   [] Pain in legs with standing  [] History of DVT   [] Phlebitis   [] Swelling in legs   [] Varicose veins   [] Non-healing ulcers Pulmonary:   [] Uses home oxygen   [] Productive cough   [] Hemoptysis   [] Wheeze  [] COPD   [] Asthma Neurologic:  [] Dizziness   [] Seizures   [] History of stroke   [] History of TIA  [] Aphasia   [] Vissual changes   [] Weakness or numbness in arm   [] Weakness or numbness in leg Musculoskeletal:    [] Joint swelling   [] Joint pain   [] Low back pain  ? History of Knee Replacement Hematologic:  [] Easy bruising  [] Easy bleeding   [] Hypercoagulable state   [] Anemic Gastrointestinal:  [] Diarrhea   [] Vomiting  [] Gastroesophageal reflux/heartburn   [] Difficulty swallowing. Genitourinary:  [] Chronic kidney disease   [] Difficult urination  [] Anuric   [] Blood in urine Skin:  [] Rashes   [] Ulcers  Psychological:  [] History of anxiety   []  History of major depression  ? Memory Difficulties     Objective:   Physical Exam  BP 139/71 (BP Location: Left Arm)   Pulse (!) 48   Resp 16   Ht 5\' 8"  (1.727 m)   Wt 197 lb 6.4 oz (89.5 kg)   BMI 30.01 kg/m   Past Medical History:  Diagnosis Date  . Hyperlipidemia   . Hypertension   . Thyroid disease      Gen: WD/WN, NAD Head: [place]/AT, No temporalis wasting.  Ear/Nose/Throat: Hearing grossly intact, nares w/o erythema or drainage Eyes: PER, EOMI, sclera nonicteric.  Neck: Supple, no masses.  No JVD.  Pulmonary:  Good air movement, no use of accessory muscles.  Cardiac: RRR Vascular:  No carotid bruit auscultated Vessel Right Left  Radial Palpable Palpable   Gastrointestinal: soft, non-distended. No guarding/no peritoneal signs.  Musculoskeletal: M/S 5/5 throughout.  No deformity or atrophy.  Neurologic: Pain and light touch intact in extremities.  Symmetrical.  Speech is fluent. Motor exam as listed above. Psychiatric: Judgment intact, Mood & affect appropriate for pt's clinical situation. Dermatologic: No Venous rashes. No Ulcers Noted.  No changes consistent with cellulitis. Lymph : No Cervical lymphadenopathy, no lichenification or skin changes of chronic lymphedema.   Social History   Socioeconomic History  . Marital status: Married    Spouse name: Not on file  . Number of children: Not on file  . Years of education: Not on file  . Highest education level: Not on file  Occupational History  . Not on file  Social Needs  .  Financial resource strain: Not on file  . Food insecurity:    Worry: Not on file    Inability: Not on file  . Transportation needs:    Medical: Not on file    Non-medical: Not on file  Tobacco Use  . Smoking status: Former Research scientist (life sciences)  . Smokeless tobacco: Never Used  Substance and Sexual Activity  . Alcohol use: No  . Drug use: No  . Sexual activity: Not on file  Lifestyle  . Physical activity:    Days per week: Not on file    Minutes per session: Not on file  . Stress: Not on file  Relationships  . Social connections:    Talks on phone: Not on file    Gets together: Not on file    Attends religious service: Not on file    Active member of club or organization: Not on file    Attends meetings of clubs or organizations: Not on file    Relationship status: Not on file  . Intimate partner violence:    Fear of current or ex partner: Not on file    Emotionally abused: Not on file    Physically abused: Not on file    Forced sexual activity: Not on file  Other Topics Concern  . Not on file  Social History Narrative  . Not on file    Past Surgical History:  Procedure Laterality Date  . APPENDECTOMY    . CAROTID ENDARTERECTOMY    . CATARACT EXTRACTION, BILATERAL    . COLONOSCOPY    . TONSILLECTOMY      Family History  Problem Relation Age of Onset  . Heart disease Mother   . Heart attack Father     No Known Allergies     Assessment & Plan:   1. Bilateral carotid artery stenosis Carotid Duplex done today shows less than 50% stenosis of the right carotid artery.  Total occlusion of the left internal carotid artery.  Bilateral vertebral arteries demonstrate antegrade flow.  Recommend:  Given the patient's asymptomatic subcritical stenosis no further invasive testing or surgery at this time.  Duplex ultrasound shows than 50% stenosis of the right ICA.  Occlusion of the left internal carotid artery.  Continue antiplatelet therapy as prescribed Continue management of  CAD, HTN and Hyperlipidemia Healthy heart diet,  encouraged exercise at least 4 times per week Follow up in 12 months with duplex ultrasound and physical exam    - VAS US CAROTID; Future  2. Essential hypertension Continue antihypertensive medications as already ordered, these medications have been reviewed and there are no changes at this time.   3. Hyperlipidemia, unspecified hyperlipidemia type Continue statin as ordered and reviewed, no changes at this time    Current Outpatient Medications on File Prior to Visit  Medication Sig Dispense Refill  . acetaminophen (TYLENOL) 650 MG CR tablet Take by mouth.    Marland Kitchen  aspirin EC 81 MG tablet Take by mouth.    . Cholecalciferol (VITAMIN D3) 1000 units CAPS Take by mouth.    . Cranberry-Vitamin C-Vitamin E 4200-20-3 MG-MG-UNIT CAPS Take by mouth.    . levothyroxine (SYNTHROID, LEVOTHROID) 75 MCG tablet TAKE 1 TABLET ONE TIME DAILY ON AN EMPTY STOMACH WITH A GLASS OF WATER AT LEAST 30 TO 60 MINUTES BEFORE BREAKFAST    . losartan (COZAAR) 25 MG tablet TAKE 1 TABLET EVERY DAY    . rosuvastatin (CRESTOR) 20 MG tablet Take by mouth.    . Ginkgo Biloba 40 MG TABS Take by mouth.    . polyethylene glycol-electrolytes (GAVILYTE-N WITH FLAVOR PACK) 420 g solution TAKE 4,000 ML ONE TIME FOR 1 DOSE.    . sildenafil (REVATIO) 20 MG tablet 2-5 tablets daily as needed     No current facility-administered medications on file prior to visit.     There are no Patient Instructions on file for this visit. No follow-ups on file.   Kris Hartmann, NP

## 2018-06-08 ENCOUNTER — Encounter: Payer: Self-pay | Admitting: *Deleted

## 2018-06-09 ENCOUNTER — Ambulatory Visit: Payer: Medicare HMO | Admitting: Anesthesiology

## 2018-06-09 ENCOUNTER — Encounter: Payer: Self-pay | Admitting: Anesthesiology

## 2018-06-09 ENCOUNTER — Ambulatory Visit
Admission: RE | Admit: 2018-06-09 | Discharge: 2018-06-09 | Disposition: A | Discharge status: Home / Self Care | Payer: Medicare HMO | Source: Ambulatory Visit | Attending: Gastroenterology | Admitting: Gastroenterology

## 2018-06-09 ENCOUNTER — Encounter
Admission: RE | Disposition: A | Discharge status: Home / Self Care | Payer: Self-pay | Source: Ambulatory Visit | Attending: Gastroenterology

## 2018-06-09 ENCOUNTER — Other Ambulatory Visit: Payer: Self-pay

## 2018-06-09 DIAGNOSIS — K5730 Diverticulosis of large intestine without perforation or abscess without bleeding: Secondary | ICD-10-CM | POA: Diagnosis not present

## 2018-06-09 DIAGNOSIS — Z86010 Personal history of colonic polyps: Secondary | ICD-10-CM | POA: Insufficient documentation

## 2018-06-09 DIAGNOSIS — D125 Benign neoplasm of sigmoid colon: Secondary | ICD-10-CM | POA: Insufficient documentation

## 2018-06-09 DIAGNOSIS — K5790 Diverticulosis of intestine, part unspecified, without perforation or abscess without bleeding: Secondary | ICD-10-CM | POA: Diagnosis not present

## 2018-06-09 DIAGNOSIS — D122 Benign neoplasm of ascending colon: Secondary | ICD-10-CM | POA: Diagnosis not present

## 2018-06-09 DIAGNOSIS — Z87891 Personal history of nicotine dependence: Secondary | ICD-10-CM | POA: Insufficient documentation

## 2018-06-09 DIAGNOSIS — D123 Benign neoplasm of transverse colon: Secondary | ICD-10-CM | POA: Diagnosis not present

## 2018-06-09 DIAGNOSIS — E785 Hyperlipidemia, unspecified: Secondary | ICD-10-CM | POA: Diagnosis not present

## 2018-06-09 DIAGNOSIS — E039 Hypothyroidism, unspecified: Secondary | ICD-10-CM | POA: Diagnosis not present

## 2018-06-09 DIAGNOSIS — D120 Benign neoplasm of cecum: Secondary | ICD-10-CM | POA: Diagnosis not present

## 2018-06-09 DIAGNOSIS — Z7982 Long term (current) use of aspirin: Secondary | ICD-10-CM | POA: Diagnosis not present

## 2018-06-09 DIAGNOSIS — K649 Unspecified hemorrhoids: Secondary | ICD-10-CM | POA: Diagnosis not present

## 2018-06-09 DIAGNOSIS — K635 Polyp of colon: Secondary | ICD-10-CM | POA: Diagnosis not present

## 2018-06-09 DIAGNOSIS — I10 Essential (primary) hypertension: Secondary | ICD-10-CM | POA: Diagnosis not present

## 2018-06-09 DIAGNOSIS — K641 Second degree hemorrhoids: Secondary | ICD-10-CM | POA: Insufficient documentation

## 2018-06-09 DIAGNOSIS — Z48815 Encounter for surgical aftercare following surgery on the digestive system: Secondary | ICD-10-CM | POA: Insufficient documentation

## 2018-06-09 DIAGNOSIS — Z1211 Encounter for screening for malignant neoplasm of colon: Secondary | ICD-10-CM | POA: Diagnosis not present

## 2018-06-09 HISTORY — DX: Benign neoplasm of colon, unspecified: D12.6

## 2018-06-09 HISTORY — PX: COLONOSCOPY WITH PROPOFOL: SHX5780

## 2018-06-09 SURGERY — COLONOSCOPY WITH PROPOFOL
Anesthesia: General

## 2018-06-09 MED ORDER — PROPOFOL 500 MG/50ML IV EMUL
INTRAVENOUS | Status: DC | PRN
  Administered 2018-06-09: 160 ug/kg/min via INTRAVENOUS

## 2018-06-09 MED ORDER — PROPOFOL 10 MG/ML IV BOLUS
INTRAVENOUS | Status: DC | PRN
  Administered 2018-06-09: 100 mg via INTRAVENOUS

## 2018-06-09 MED ORDER — SODIUM CHLORIDE 0.9 % IV SOLN
INTRAVENOUS | Status: DC
  Administered 2018-06-09 (×2): via INTRAVENOUS

## 2018-06-09 MED ORDER — LIDOCAINE 2% (20 MG/ML) 5 ML SYRINGE
INTRAMUSCULAR | Status: DC | PRN
  Administered 2018-06-09: 30 mg via INTRAVENOUS

## 2018-06-09 MED ORDER — EPHEDRINE SULFATE 50 MG/ML IJ SOLN
INTRAMUSCULAR | Status: DC | PRN
  Administered 2018-06-09 (×3): 10 mg via INTRAVENOUS

## 2018-06-09 MED ORDER — FENTANYL CITRATE (PF) 100 MCG/2ML IJ SOLN
INTRAMUSCULAR | Status: DC | PRN
  Administered 2018-06-09 (×2): 50 ug via INTRAVENOUS

## 2018-06-09 MED ORDER — SPOT INK MARKER SYRINGE KIT
PACK | SUBMUCOSAL | Status: DC | PRN
  Administered 2018-06-09: 2 mL via SUBMUCOSAL

## 2018-06-09 NOTE — Anesthesia Preprocedure Evaluation (Signed)
Anesthesia Evaluation  Patient identified by MRN, date of birth, ID band Patient awake    Reviewed: Allergy & Precautions, H&P , NPO status , Patient's Chart, lab work & pertinent test results, reviewed documented beta blocker date and time   History of Anesthesia Complications Negative for: history of anesthetic complications  Airway Mallampati: I  TM Distance: >3 FB Neck ROM: full    Dental  (+) Upper Dentures, Lower Dentures, Dental Advidsory Given   Pulmonary neg pulmonary ROS, former smoker,           Cardiovascular Exercise Tolerance: Good hypertension, (-) angina(-) CAD, (-) Past MI, (-) Cardiac Stents and (-) CABG (-) dysrhythmias (-) Valvular Problems/Murmurs     Neuro/Psych negative neurological ROS  negative psych ROS   GI/Hepatic negative GI ROS, Neg liver ROS,   Endo/Other  Hypothyroidism   Renal/GU negative Renal ROS  negative genitourinary   Musculoskeletal   Abdominal   Peds  Hematology negative hematology ROS (+)   Anesthesia Other Findings Past Medical History: No date: Hyperlipidemia No date: Hypertension No date: Serrated adenoma of colon No date: Thyroid disease No date: Thyroid disease No date: Tubular adenoma of colon   Reproductive/Obstetrics negative OB ROS                             Anesthesia Physical Anesthesia Plan  ASA: II  Anesthesia Plan: General   Post-op Pain Management:    Induction: Intravenous  PONV Risk Score and Plan: 2 and Propofol infusion and TIVA  Airway Management Planned: Nasal Cannula and Natural Airway  Additional Equipment:   Intra-op Plan:   Post-operative Plan:   Informed Consent: I have reviewed the patients History and Physical, chart, labs and discussed the procedure including the risks, benefits and alternatives for the proposed anesthesia with the patient or authorized representative who has indicated his/her  understanding and acceptance.   Dental Advisory Given  Plan Discussed with: Anesthesiologist, CRNA and Surgeon  Anesthesia Plan Comments:         Anesthesia Quick Evaluation

## 2018-06-09 NOTE — Op Note (Signed)
[facility] Gastroenterology Patient Name: Harold Harris Procedure Date: 06/09/2018 2:21 PM MRN: [id number] Account #: [id number] Date of Birth: 03/27/1951 Admit Type: Outpatient Age: 67 Room: [facility] ENDO ROOM 3 Gender: Male Note Status: Finalized Procedure:            Colonoscopy Indications:          Personal history of colonic polyps Providers:            Lollie Sails, MD Referring MD:         Leonie Douglas. Doy Hutching, MD (Referring MD) Medicines:            Monitored Anesthesia Care Complications:        No immediate complications. Hemostasis was good at all                        sites. Procedure:            Pre-Anesthesia Assessment:                       - ASA Grade Assessment: II - A patient with mild                        systemic disease.                       After obtaining informed consent, the colonoscope was                        passed under direct vision. Throughout the procedure,                        the patient's blood pressure, pulse, and oxygen                        saturations were monitored continuously. The                        Colonoscope was introduced through the anus and                        advanced to the the cecum, identified by appendiceal                        orifice and ileocecal valve. The colonoscopy was                        performed with moderate difficulty due to a tortuous                        colon. The patient tolerated the procedure well. The                        quality of the bowel preparation was fair. Findings:      Multiple small-mouthed diverticula were found in the sigmoid colon,       descending colon and transverse colon.      A few small-mouthed diverticula were found in the sigmoid colon.       Erythema was seen in association with the diverticular opening.      A 6 mm polyp was found in the transverse colon. The polyp was sessile.  The polyp was removed with a cold snare. Resection and  retrieval were       complete.      Three sessile polyps were found in the transverse colon. The polyps were       2 to 3 mm in size. These polyps were removed with a cold biopsy forceps.       Resection and retrieval were complete.      Two sessile polyps were found in the ascending colon. The polyps were 4       to 5 mm in size. These polyps were removed with a cold snare. Resection       and retrieval were complete.      Two sessile polyps were found in the cecum. The polyps were 1 to 3 mm in       size. These polyps were removed with a cold biopsy forceps. Resection       and retrieval were complete.      Atypical lesion, umbilical shaped with sunken center and raised edge in       the base of the cecum. Biopsies were taken with a cold forceps for       histology.      A 2 mm polyp was found in the proximal ascending colon. The polyp was       sessile. The polyp was removed with a cold biopsy forceps. Resection and       retrieval were complete.      Two Polyps were found in the hepatic flexure. The polyps were 8 to 15 mm       in size. One of these were sessile, serrated in appearance. the larger,       with a mucus cap. this was removed with multiple passes of a cold and       hot snare. The smaller nearly pedunculated polyp was removed with a hot       snare. Both were retrieved, multiple peices.      A 8 mm polyp was found in the transverse colon. The polyp was sessile.       The polyp was removed with a hot snare. Resection and retrieval were       complete.      A 16 mm polyp was found in the mid sigmoid colon. The polyp was sessile.       The polyp was removed with a lift and cut technique using a cold snare.       Resection and retrieval were complete. Area was tattooed with an       injection of 3 mL of [place] ink.      A 3 mm polyp was found in the distal sigmoid colon. The polyp was       sessile. The polyp was removed with a cold snare. Resection and       retrieval were  complete.      The retroflexed view of the distal rectum and anal verge was normal and       showed no anal or rectal abnormalities.      Non-bleeding external and internal hemorrhoids were found during       perianal exam, during digital exam and during anoscopy. The hemorrhoids       were small and Grade II (internal hemorrhoids that prolapse but reduce       spontaneously). Impression:           - Preparation of the colon was  fair.                       - Diverticulosis in the sigmoid colon, in the                        descending colon and in the transverse colon.                       - Mild diverticulosis in the sigmoid colon. Erythema                        was seen in association with the diverticular opening.                       - One 6 mm polyp in the transverse colon, removed with                        a cold snare. Resected and retrieved.                       - Three 2 to 3 mm polyps in the transverse colon,                        removed with a cold biopsy forceps. Resected and                        retrieved.                       - Two 4 to 5 mm polyps in the ascending colon, removed                        with a cold snare. Resected and retrieved.                       - Two 1 to 3 mm polyps in the cecum, removed with a                        cold biopsy forceps. Resected and retrieved.                       - One 2 mm polyp in the proximal ascending colon,                        removed with a cold biopsy forceps. Resected and                        retrieved.                       - Two 8 to 15 mm polyps at the hepatic flexure.                       - One 8 mm polyp in the transverse colon, removed with                        a hot snare. Resected and retrieved.                       -  One 16 mm polyp in the mid sigmoid colon, removed                        using lift and cut and a cold snare. Resected and                        retrieved. Tattooed.                        - One 3 mm polyp in the distal sigmoid colon, removed                        with a cold snare. Resected and retrieved.                       - The distal rectum and anal verge are normal on                        retroflexion view.                       - Non-bleeding external and internal hemorrhoids. Recommendation:       - Await pathology results.                       - Use Analpram HC Cream 2.5%: Apply externally TID for                        10 days. Procedure Code(s):    --- Professional ---                       [phone], Colonoscopy, flexible; with removal of tumor(s),                        polyp(s), or other lesion(s) by snare technique                       45380, 59, Colonoscopy, flexible; with biopsy, single                        or multiple                       45381, Colonoscopy, flexible; with directed submucosal                        injection(s), any substance Diagnosis Code(s):    --- Professional ---                       K64.1, Second degree hemorrhoids                       D12.2, Benign neoplasm of ascending colon                       D12.3, Benign neoplasm of transverse colon (hepatic                        flexure or splenic flexure)  D12.5, Benign neoplasm of sigmoid colon                       D12.0, Benign neoplasm of cecum                       Z86.010, Personal history of colonic polyps                       K57.30, Diverticulosis of large intestine without                        perforation or abscess without bleeding CPT copyright 2017 American Medical Association. All rights reserved. The codes documented in this report are preliminary and upon coder review may  be revised to meet current compliance requirements. Lollie Sails, MD 06/09/2018 3:56:00 PM This report has been signed electronically. Number of Addenda: 0 Note Initiated On: 06/09/2018 2:21 PM Scope Withdrawal Time: 0 hours 34 minutes 14 seconds  Total  Procedure Duration: 1 hour 3 minutes 24 seconds       [facility]

## 2018-06-09 NOTE — H&P (Signed)
Outpatient short stay form Pre-procedure 06/09/2018 2:21 PM Lollie Sails MD  Primary Physician: Fulton Reek, MD  Reason for visit: Colonoscopy  History of present illness: Patient is a 67 year old male presenting today as above.  He has personal history of adenomatous colon polyps with his last colonoscopy being done 09/18/2015.  At that time he had 11 polyps removed 1 of these being over 10 mm and all of the others were tubular adenomas.  Tolerated his prep well.  He takes no aspirin or blood thinning agent with the exception of 81 mg aspirin.    Current Facility-Administered Medications:  .  0.9 %  sodium chloride infusion, , Intravenous, Continuous, Lollie Sails, MD, Last Rate: 20 mL/hr at 06/09/18 1322  Medications Prior to Admission  Medication Sig Dispense Refill Last Dose  . aspirin EC 81 MG tablet Take by mouth.   Past Week at Unknown time  . Cholecalciferol (VITAMIN D3) 1000 units CAPS Take by mouth.   Past Week at Unknown time  . Cranberry-Vitamin C-Vitamin E 4200-20-3 MG-MG-UNIT CAPS Take by mouth.   Past Week at Unknown time  . levothyroxine (SYNTHROID, LEVOTHROID) 75 MCG tablet TAKE 1 TABLET ONE TIME DAILY ON AN EMPTY STOMACH WITH A GLASS OF WATER AT LEAST 30 TO 60 MINUTES BEFORE BREAKFAST   Past Week at Unknown time  . losartan (COZAAR) 25 MG tablet TAKE 1 TABLET EVERY DAY   Past Week at Unknown time  . rosuvastatin (CRESTOR) 20 MG tablet Take by mouth.   Past Week at Unknown time  . acetaminophen (TYLENOL) 650 MG CR tablet Take by mouth.   Not Taking at Unknown time  . Ginkgo Biloba 40 MG TABS Take by mouth.   Not Taking at Unknown time  . polyethylene glycol-electrolytes (GAVILYTE-N WITH FLAVOR PACK) 420 g solution TAKE 4,000 ML ONE TIME FOR 1 DOSE.   Not Taking at Unknown time  . predniSONE (DELTASONE) 10 MG tablet Take 10 mg by mouth daily with breakfast.   Not Taking at Unknown time  . sildenafil (REVATIO) 20 MG tablet 2-5 tablets daily as needed   Not Taking  at Unknown time     No Known Allergies   Past Medical History:  Diagnosis Date  . Hyperlipidemia   . Hypertension   . Serrated adenoma of colon   . Thyroid disease   . Thyroid disease   . Tubular adenoma of colon     Review of systems:      Physical Exam    Heart and lungs: Rhythm without rub or gallop, lungs are bilaterally clear.    HEENT: Normocephalic atraumatic eyes are anicteric    Other:    Pertinant exam for procedure: Soft nontender nondistended bowel sounds positive normoactive.    Planned proceedures: Colonoscopy and indicated procedures. I have discussed the risks benefits and complications of procedures to include not limited to bleeding, infection, perforation and the risk of sedation and the patient wishes to proceed.    Lollie Sails, MD Gastroenterology 06/09/2018  2:21 PM

## 2018-06-09 NOTE — Anesthesia Post-op Follow-up Note (Signed)
Anesthesia QCDR form completed.        

## 2018-06-09 NOTE — Transfer of Care (Signed)
Immediate Anesthesia Transfer of Care Note  Patient: Harold Harris  Procedure(s) Performed: Procedure(s): COLONOSCOPY WITH PROPOFOL (N/A)  Patient Location: PACU and Endoscopy Unit  Anesthesia Type:General  Level of Consciousness: sedated  Airway & Oxygen Therapy: Patient Spontanous Breathing and Patient connected to nasal cannula oxygen  Post-op Assessment: Report given to RN and Post -op Vital signs reviewed and stable  Post vital signs: Reviewed and stable  Last Vitals:  Vitals:   06/09/18 1258 06/09/18 1547  BP: (!) 127/97 (!) 94/43  Pulse:  62  Resp: 16 13  Temp: (!) 35.2 C   SpO2: 64% 68%    Complications: No apparent anesthesia complications

## 2018-06-10 ENCOUNTER — Encounter: Payer: Self-pay | Admitting: Gastroenterology

## 2018-06-12 NOTE — Anesthesia Postprocedure Evaluation (Signed)
Anesthesia Post Note  Patient: Harold Harris  Procedure(s) Performed: COLONOSCOPY WITH PROPOFOL (N/A )  Patient location during evaluation: PACU Anesthesia Type: General Level of consciousness: awake and alert and oriented Pain management: pain level controlled Vital Signs Assessment: post-procedure vital signs reviewed and stable Respiratory status: spontaneous breathing Cardiovascular status: blood pressure returned to baseline Anesthetic complications: no     Last Vitals:  Vitals:   06/09/18 1617 06/09/18 1627  BP:    Pulse: 85 66  Resp: 20 14  Temp:    SpO2: 98% 97%    Last Pain:  Vitals:   06/09/18 1627  TempSrc:   PainSc: 0-No pain                 Yemaya Barnier

## 2018-06-14 LAB — SURGICAL PATHOLOGY

## 2018-07-06 DIAGNOSIS — Z86010 Personal history of colonic polyps: Secondary | ICD-10-CM | POA: Diagnosis not present

## 2018-07-06 DIAGNOSIS — D126 Benign neoplasm of colon, unspecified: Secondary | ICD-10-CM | POA: Diagnosis not present

## 2018-07-10 ENCOUNTER — Encounter: Payer: Self-pay | Admitting: Genetics

## 2018-07-10 ENCOUNTER — Telehealth: Payer: Self-pay | Admitting: Genetics

## 2018-07-10 NOTE — Telephone Encounter (Signed)
New referral received from Stephens November, NP for genetic counseling. Pt has been scheduled to see Ferol Luz on 11/25 at 2pm. Unable to treach the pt because his vm is full. Will notify the referring to call the pt. Letter mailed.

## 2018-07-31 ENCOUNTER — Inpatient Hospital Stay: Payer: Medicare HMO | Attending: Genetic Counselor | Admitting: Licensed Clinical Social Worker

## 2018-07-31 ENCOUNTER — Inpatient Hospital Stay: Payer: Medicare HMO

## 2018-07-31 ENCOUNTER — Encounter: Payer: Self-pay | Admitting: Licensed Clinical Social Worker

## 2018-07-31 DIAGNOSIS — Z1379 Encounter for other screening for genetic and chromosomal anomalies: Secondary | ICD-10-CM | POA: Diagnosis not present

## 2018-07-31 DIAGNOSIS — Z83719 Family history of colon polyps, unspecified: Secondary | ICD-10-CM

## 2018-07-31 DIAGNOSIS — Z860100 Personal history of colon polyps, unspecified: Secondary | ICD-10-CM

## 2018-07-31 DIAGNOSIS — Z8371 Family history of colonic polyps: Secondary | ICD-10-CM | POA: Diagnosis not present

## 2018-07-31 NOTE — Progress Notes (Signed)
REFERRING PROVIDER: Ok Edwards, NP [place], [place] [address]  PRIMARY PROVIDER:  Idelle Crouch, MD  PRIMARY REASON FOR VISIT:  1. Personal history of colonic polyps   2. Family history of colonic polyps      HISTORY OF PRESENT ILLNESS:   Harold Harris, a 67 y.o. male, was seen for a [place] cancer genetics consultation at the request of Dr. Jacqulyn Harris due to a personal history of colon polyps. Harold Harris presents to clinic today to discuss the possibility of a hereditary predisposition to cancer, genetic testing, and to further clarify his future cancer risks, as well as potential cancer risks for family members.   In 2019 Harold Harris had a colonoscopy that revealed 12 tubular adenomas and 2 sessile serrated adenomas. He reports that he had 11 polyps on his 2017 colonoscopy. We estimate he has had a cumulative total of 24 colon polyps.   Harold Harris also reports that he has his PSA every year and it has been normal.  He does not drink but smokes occasionally. No major exposures to radiation.   Past Medical History:  Diagnosis Date  . Family history of colonic polyps   . Hyperlipidemia   . Hypertension   . Personal history of colonic polyps   . Serrated adenoma of colon   . Thyroid disease   . Thyroid disease   . Tubular adenoma of colon     Past Surgical History:  Procedure Laterality Date  . APPENDECTOMY    . CAROTID ENDARTERECTOMY    . CATARACT EXTRACTION, BILATERAL    . COLONOSCOPY    . COLONOSCOPY WITH PROPOFOL N/A 06/09/2018   Procedure: COLONOSCOPY WITH PROPOFOL;  Surgeon: Harold Sails, MD;  Location: [facility] ENDOSCOPY;  Service: Endoscopy;  Laterality: N/A;  . TONSILLECTOMY      Social History   Socioeconomic History  . Marital status: Married    Spouse name: Not on file  . Number of children: Not on file  . Years of education: Not on file  . Highest education level: Not on file  Occupational  History  . Not on file  Social Needs  . Financial resource strain: Not on file  . Food insecurity:    Worry: Not on file    Inability: Not on file  . Transportation needs:    Medical: Not on file    Non-medical: Not on file  Tobacco Use  . Smoking status: Former Research scientist (life sciences)  . Smokeless tobacco: Never Used  Substance and Sexual Activity  . Alcohol use: No  . Drug use: No  . Sexual activity: Not on file  Lifestyle  . Physical activity:    Days per week: Not on file    Minutes per session: Not on file  . Stress: Not on file  Relationships  . Social connections:    Talks on phone: Not on file    Gets together: Not on file    Attends religious service: Not on file    Active member of club or organization: Not on file    Attends meetings of clubs or organizations: Not on file    Relationship status: Not on file  Other Topics Concern  . Not on file  Social History Narrative  . Not on file     FAMILY HISTORY:  We obtained a detailed, 4-generation family history.  Significant diagnoses are listed below: Family History  Problem Relation Age of Onset  . Heart disease Mother   .  Heart attack Father   . Colon polyps Brother        5 total  . Cancer Maternal Aunt        unk type   Harold Harris has one son, age 82, who has not yet had a colonoscopy. He had a sister who passed away in her 87's and a brother who is living at 69 that has a history of 5 colon polyps.   Harold Harris does not have much information about his father's side of the family. His father died at 27 from heart issues. He had three brothers and three sisters. Harold Harris does not believe any of them had cancer. He does not have any information about his paternal cousins. His paternal grandfather died in a motor vehicle accident, unknown age. His paternal grandmother died in her 16's.   Harold Harris mother died at 32 from heart issues. She had 13 brothers and sisters. One of her sisters, Harold Harris. Harris maternal aunt, had  cancer but he does not know the type. He does not know much about his maternal cousins. His maternal grandfather died at 47, maternal grandmother died in her 31's.   Harold Harris is unaware of previous family history of genetic testing for hereditary cancer risks. Patient's ancestors are of Scottish/Scandanavian/Italian descent. There is no reported Ashkenazi Jewish ancestry. There is no known consanguinity.  GENETIC COUNSELING ASSESSMENT: Harold Harris is a 67 y.o. male with a personal history which is somewhat suggestive of a Hereditary Cancer Predisposition Syndrome/Polyposis Syndrome. We, therefore, discussed and recommended the following at today's visit.   DISCUSSION: We discussed that polyps in general are common, however, most people have fewer than 5 lifetime polyps.  When an individual has 10 or more polyps we become concerned about an underlying polyposis syndrome.  The most common hereditary polyposis syndromes are caused by problems in the APC and MUTYH genes, however, more recently, mutations in the [place] and MSH3 genes have been identified in some polyposis families.     We reviewed the characteristics, features and inheritance patterns of hereditary cancer syndromes. We also discussed genetic testing, including the appropriate family members to test, the process of testing, insurance coverage and turn-around-time for results. We discussed the implications of a negative, positive and/or variant of uncertain significant result. We recommended [name] pursue genetic testing for the [employer] Panel + Colorectal Cancer panel.   The Multi-Cancer Panel offered by [facility] includes sequencing and/or deletion duplication testing of the following 84 genes: AIP, ALK, APC, ATM, AXIN2,BAP1,  BARD1, BLM, BMPR1A, BRCA1, BRCA2, BRIP1, CASR, CDC73, CDH1, CDK4, CDKN1B, CDKN1C, CDKN2A (p14ARF), CDKN2A (p16INK4a), CEBPA, CHEK2, CTNNA1, DICER1, DIS3L2, EGFR (c.2369C>T, p.Thr790Met variant only),  EPCAM (Deletion/duplication testing only), FH, FLCN, GATA2, GPC3, GREM1 (Promoter region deletion/duplication testing only), HOXB13 (c.251G>A, p.Gly84Glu), HRAS, KIT, MAX, MEN1, MET, MITF (c.952G>A, p.Glu318Lys variant only), MLH1, MSH2, MSH3, MSH6, MUTYH, NBN, NF1, NF2, NTHL1, PALB2, PDGFRA, PHOX2B, PMS2, POLD1, POLE, POT1, PRKAR1A, PTCH1, PTEN, RAD50, RAD51C, RAD51D, RB1, RECQL4, RET, RUNX1, SDHAF2, SDHA (sequence changes only), SDHB, SDHC, SDHD, SMAD4, SMARCA4, SMARCB1, SMARCE1, STK11, SUFU, TERC, TERT, TMEM127, TP53, TSC1, TSC2, VHL, WRN and WT1.   The Colorectal Cancer Panel offered by [facility] includes sequencing and/or deletion duplication testing of the following 30 genes: APC, AXIN2, BMPR1A, CDH1, CHEK2, EPCAM, GREM1, MLH1, MSH2, MSH3, MSH6, MUTYH, NTHL1, PMS2, POLD1, POLE, PTEN, SMAD4, STK11, TP53, ATM, BLM, BUB1B, CEP57, ENG, FLCN, GALNT12, MLH3, RNF43, RPS20.  We discussed that if he is found to have a mutation  in one of these genes, it may impact future medical management recommendations such as increased cancer screenings and consideration of risk reducing surgeries.  A positive result could also have implications for the patient's family members.   A Negative result would mean we were unable to identify a hereditary component to his development of adenomatous polyps, but does not rule out the possibility of a hereditary basis for his polyps. There could be mutations that are undetectable by current technology, or in genes not yet tested or identified to increase cancer risk.     We discussed the potential to find a Variant of Uncertain Significance or VUS.  These are variants that have not yet been identified as pathogenic or benign, and it is unknown if this variant is associated with increased cancer risk or if this is a normal finding.  Most VUS's are reclassified to benign or likely benign.   It should not be used to make medical management decisions. With time, we suspect the lab will  determine the significance of any VUS's identified if any.   Based on Harold Harris's personal history of polyps, he meets NCCN medical criteria for genetic testing. Despite that he meets criteria, he may still have an out of pocket cost. The lab will provide him with an OOP if any.  We discussed that some people do not want to undergo genetic testing due to fear of genetic discrimination.  A federal law called the Genetic Information Non-Discrimination Act (GINA) of 2008 helps protect individuals against genetic discrimination based on their genetic test results.  It impacts both health insurance and employment.  For health insurance, it protects against increased premiums, being kicked off insurance or being forced to take a test in order to be insured.  For employment it protects against hiring, firing and promoting decisions based on genetic test results.  Health status due to a cancer diagnosis is not protected under GINA.  This law does not protect life insurance, disability insurance, or other types of insurance.   PLAN: After considering the risks, benefits, and limitations, Harold Harris  provided informed consent to pursue genetic testing and the blood sample was sent to [facility] for analysis of the Multi-Cancer Panel + Colorectal Cancer Panel. Results should be available within approximately 2-3 weeks' time, at which point they will be disclosed by telephone to Harold Harris, as will any additional recommendations warranted by these results. Harold Harris will receive a summary of his genetic counseling visit and a copy of his results once available. This information will also be available in Epic.   Lastly, we encouraged Harold Harris. Ahles to remain in contact with cancer genetics annually so that we can continuously update the family history and inform him of any changes in cancer genetics and testing that may be of benefit for this family.   Harold Harris.  Xiang questions were answered to his satisfaction  today. Our contact information was provided should additional questions or concerns arise. Thank you for the referral and allowing [place] to share in the care of your patient.   Faith Rogue, MS Genetic Counselor [email or web address] Phone: [phone]  The patient was seen for a total of 30 minutes in face-to-face genetic counseling.

## 2018-08-08 ENCOUNTER — Telehealth: Payer: Self-pay | Admitting: Licensed Clinical Social Worker

## 2018-08-09 ENCOUNTER — Ambulatory Visit: Payer: Self-pay | Admitting: Licensed Clinical Social Worker

## 2018-08-09 ENCOUNTER — Encounter: Payer: Self-pay | Admitting: Licensed Clinical Social Worker

## 2018-08-09 DIAGNOSIS — Z1379 Encounter for other screening for genetic and chromosomal anomalies: Secondary | ICD-10-CM | POA: Insufficient documentation

## 2018-08-09 NOTE — Telephone Encounter (Signed)
Revealed negative genetic testing.  Revealed that a VUS in TMEM127 was identified.  We discussed that we do not know why he has colon polyps. It could be due to a different gene that we are not testing, or something our current technology cannot pick up.  It will be important for him to keep in contact with genetics to learn if additional testing may be needed in the future. He should continue to follow doctor recommendations regarding screening.

## 2018-08-09 NOTE — Progress Notes (Addendum)
HPI:  [name] was previously seen in the [place] clinic on 07/31/2018 due to a personal and family history of colon polyps and concerns regarding a hereditary predisposition to cancer. Please refer to our prior cancer genetics clinic note for more information regarding [name]'s medical, social and family histories, and our assessment and recommendations, at the time. [name] recent genetic test results were disclosed to him, as well as recommendations warranted by these results. These results and recommendations are discussed in more detail below.  In 2019 [name] had a colonoscopy that revealed 12 tubular adenomas and 2 sessile serrated adenomas. He reports that he had 11 polyps on his 2017 colonoscopy. We estimate he has had a cumulative total of 24 colon polyps.   CANCER HISTORY:   No history exists.     FAMILY HISTORY:  We obtained a detailed, 4-generation family history.  Significant diagnoses are listed below: Family History  Problem Relation Age of Onset  . Heart disease Mother   . Heart attack Father   . Colon polyps Brother        5 total  . Cancer Maternal Aunt        unk type   [name] has one son, age 69, who has not yet had a colonoscopy. He had a sister who passed away in her 57's and a brother who is living at 67 that has a history of 5 colon polyps.   [name] does not have much information about his father's side of the family. His father died at 38 from heart issues. He had three brothers and three sisters. [name] does not believe any of them had cancer. He does not have any information about his paternal cousins. His paternal grandfather died in a motor vehicle accident, unknown age. His paternal grandmother died in her 37's.   [name] mother died at 38 from heart issues. She had 13 brothers and sisters. One of her sisters, [name] maternal aunt, had cancer but he does not know the type. He does not know much about his  maternal cousins. His maternal grandfather died at 62, maternal grandmother died in her 55's.   [name] is unaware of previous family history of genetic testing for hereditary cancer risks. Patient's ancestors are of Scottish/Scandanavian/Italian descent. There is no reported Ashkenazi Jewish ancestry. There is no known consanguinity.  GENETIC TEST RESULTS: Genetic testing performed through [facility]'s Multi-Cancer Panel + Colorectal Cancer Panel reported out on 08/07/2018 showed no pathogenic mutations. The Multi-Cancer Panel offered by [facility] includes sequencing and/or deletion duplication testing of the following 84 genes: AIP, ALK, APC, ATM, AXIN2,BAP1,  BARD1, BLM, BMPR1A, BRCA1, BRCA2, BRIP1, CASR, CDC73, CDH1, CDK4, CDKN1B, CDKN1C, CDKN2A (p14ARF), CDKN2A (p16INK4a), CEBPA, CHEK2, CTNNA1, DICER1, DIS3L2, EGFR (c.2369C>T, p.Thr790Met variant only), EPCAM (Deletion/duplication testing only), FH, FLCN, GATA2, GPC3, GREM1 (Promoter region deletion/duplication testing only), HOXB13 (c.251G>A, p.Gly84Glu), HRAS, KIT, MAX, MEN1, MET, MITF (c.952G>A, p.Glu318Lys variant only), MLH1, MSH2, MSH3, MSH6, MUTYH, NBN, NF1, NF2, NTHL1, PALB2, PDGFRA, PHOX2B, PMS2, POLD1, POLE, POT1, PRKAR1A, PTCH1, PTEN, RAD50, RAD51C, RAD51D, RB1, RECQL4, RET, RUNX1, SDHAF2, SDHA (sequence changes only), SDHB, SDHC, SDHD, SMAD4, SMARCA4, SMARCB1, SMARCE1, STK11, SUFU, TERC, TERT, TMEM127, TP53, TSC1, TSC2, VHL, WRN and WT1. The Colorectal Cancer Panel offered by [facility] includes sequencing and/or deletion duplication testing of the following 30 genes: APC, AXIN2, BMPR1A, CDH1, CHEK2, EPCAM, GREM1, MLH1, MSH2, MSH3, MSH6, MUTYH, NTHL1, PMS2, POLD1, POLE, PTEN, SMAD4, STK11, TP53, ATM, BLM, BUB1B,  CEP57, ENG, FLCN, GALNT12, MLH3, RNF43, RPS20.  A variant of uncertain significance (VUS) in a gene called TMEM127 was also noted.   The test report will be scanned into EPIC and will be located under the Molecular Pathology section of  the Results Review tab. A portion of the result report is included below for reference.     We discussed with [name] that because current genetic testing is not perfect, it is possible there may be a gene mutation in one of these genes that current testing cannot detect, but that chance is small.  We also discussed, that there could be another gene that has not yet been discovered, or that we have not yet tested, that is responsible for the cancer diagnoses in the family. It is also possible there is a hereditary cause for the cancer in the family that [name] did not inherit and therefore was not identified in his testing.  Therefore, it is important to remain in touch with cancer genetics in the future so that we can continue to offer [name] the most up to date genetic testing.   Regarding the VUS in TMEM127: At this time, it is unknown if this variant is associated with increased cancer risk or if this is a normal finding, but most variants such as this get reclassified to being inconsequential. It should not be used to make medical management decisions. With time, we suspect the lab will determine the significance of this variant, if any. If we do learn more about it, we will try to contact [name] to discuss it further. However, it is important to stay in touch with [place] periodically and keep the address and phone number up to date.  ADDITIONAL GENETIC TESTING: We discussed with [name] that his genetic testing was fairly extensive.  If there are are genes identified to increase cancer risk that can be analyzed in the future, we would be happy to discuss and coordinate this testing at that time.    CANCER SCREENING RECOMMENDATIONS: [name] test result is considered negative (normal).  This means that we have not identified a hereditary cause for his personal history and family history of colon polyps at this time.  While reassuring, this does not definitively rule out a  hereditary predisposition to colon polyps/cancer. It is still possible that there could be genetic mutations that are undetectable by current technology, or genetic mutations in genes that have not been tested or identified to increase cancer risk.  Therefore, it is recommended he continue to follow the cancer management and screening guidelines provided by his healthcare providers. An individual's cancer risk is not determined by genetic test results alone.  Overall cancer risk assessment includes additional factors such as personal medical history, family history, etc.  These should be used to make a personalized plan for cancer prevention and surveillance.    RECOMMENDATIONS FOR FAMILY MEMBERS: We recommended women in this family have a yearly mammogram beginning at age 60, or 75 years younger than the earliest onset of cancer, an annual clinical breast exam, and perform monthly breast self-exams. Women in this family should also have a gynecological exam as recommended by their primary provider. All family members should have a colonoscopy as directed by their doctors.  All family members should inform their physicians about the family history of cancer so their doctors can make the most appropriate screening recommendations for them.   FOLLOW-UP: Lastly, we discussed with [name] that cancer genetics is  a rapidly advancing field and it is possible that new genetic tests will be appropriate for him and/or his family members in the future. We encouraged him to remain in contact with cancer genetics on an annual basis so we can update his personal and family histories and let him know of advances in cancer genetics that may benefit this family.   Our contact number was provided. [name] questions were answered to his satisfaction, and he knows he is welcome to call us at anytime with additional questions or concerns.  Faith Rogue, MS Genetic Counselor [email or web address] Phone:  [phone]

## 2018-08-21 DIAGNOSIS — E782 Mixed hyperlipidemia: Secondary | ICD-10-CM | POA: Diagnosis not present

## 2018-08-21 DIAGNOSIS — Z79899 Other long term (current) drug therapy: Secondary | ICD-10-CM | POA: Diagnosis not present

## 2018-08-21 DIAGNOSIS — E039 Hypothyroidism, unspecified: Secondary | ICD-10-CM | POA: Diagnosis not present

## 2018-08-21 DIAGNOSIS — Z0000 Encounter for general adult medical examination without abnormal findings: Secondary | ICD-10-CM | POA: Diagnosis not present

## 2018-08-21 DIAGNOSIS — Z125 Encounter for screening for malignant neoplasm of prostate: Secondary | ICD-10-CM | POA: Diagnosis not present

## 2018-08-21 DIAGNOSIS — R252 Cramp and spasm: Secondary | ICD-10-CM | POA: Diagnosis not present

## 2018-08-21 DIAGNOSIS — I10 Essential (primary) hypertension: Secondary | ICD-10-CM | POA: Diagnosis not present

## 2018-08-21 DIAGNOSIS — I739 Peripheral vascular disease, unspecified: Secondary | ICD-10-CM | POA: Diagnosis not present

## 2018-09-07 ENCOUNTER — Encounter: Payer: Self-pay | Admitting: *Deleted

## 2018-09-08 ENCOUNTER — Ambulatory Visit: Payer: Medicare HMO | Admitting: Anesthesiology

## 2018-09-08 ENCOUNTER — Other Ambulatory Visit: Payer: Self-pay

## 2018-09-08 ENCOUNTER — Encounter
Admission: RE | Disposition: A | Discharge status: Home / Self Care | Payer: Self-pay | Source: Home / Self Care | Attending: Gastroenterology

## 2018-09-08 ENCOUNTER — Ambulatory Visit
Admission: RE | Admit: 2018-09-08 | Discharge: 2018-09-08 | Disposition: A | Discharge status: Home / Self Care | Payer: Medicare HMO | Attending: Gastroenterology | Admitting: Gastroenterology

## 2018-09-08 DIAGNOSIS — Z86010 Personal history of colonic polyps: Secondary | ICD-10-CM | POA: Insufficient documentation

## 2018-09-08 DIAGNOSIS — K5730 Diverticulosis of large intestine without perforation or abscess without bleeding: Secondary | ICD-10-CM | POA: Insufficient documentation

## 2018-09-08 DIAGNOSIS — Z7982 Long term (current) use of aspirin: Secondary | ICD-10-CM | POA: Insufficient documentation

## 2018-09-08 DIAGNOSIS — K644 Residual hemorrhoidal skin tags: Secondary | ICD-10-CM | POA: Insufficient documentation

## 2018-09-08 DIAGNOSIS — E785 Hyperlipidemia, unspecified: Secondary | ICD-10-CM | POA: Diagnosis not present

## 2018-09-08 DIAGNOSIS — Z48815 Encounter for surgical aftercare following surgery on the digestive system: Secondary | ICD-10-CM | POA: Diagnosis not present

## 2018-09-08 DIAGNOSIS — E079 Disorder of thyroid, unspecified: Secondary | ICD-10-CM | POA: Insufficient documentation

## 2018-09-08 DIAGNOSIS — D120 Benign neoplasm of cecum: Secondary | ICD-10-CM | POA: Diagnosis not present

## 2018-09-08 DIAGNOSIS — K641 Second degree hemorrhoids: Secondary | ICD-10-CM | POA: Insufficient documentation

## 2018-09-08 DIAGNOSIS — Z7952 Long term (current) use of systemic steroids: Secondary | ICD-10-CM | POA: Diagnosis not present

## 2018-09-08 DIAGNOSIS — Z87891 Personal history of nicotine dependence: Secondary | ICD-10-CM | POA: Diagnosis not present

## 2018-09-08 DIAGNOSIS — I10 Essential (primary) hypertension: Secondary | ICD-10-CM | POA: Diagnosis not present

## 2018-09-08 HISTORY — PX: COLONOSCOPY WITH PROPOFOL: SHX5780

## 2018-09-08 SURGERY — COLONOSCOPY WITH PROPOFOL
Anesthesia: General

## 2018-09-08 MED ORDER — PROPOFOL 10 MG/ML IV BOLUS
INTRAVENOUS | Status: DC | PRN
  Administered 2018-09-08: 80 mg via INTRAVENOUS

## 2018-09-08 MED ORDER — PROPOFOL 500 MG/50ML IV EMUL
INTRAVENOUS | Status: DC | PRN
  Administered 2018-09-08: 150 ug/kg/min via INTRAVENOUS

## 2018-09-08 MED ORDER — SODIUM CHLORIDE 0.9 % IV SOLN
INTRAVENOUS | Status: DC
  Administered 2018-09-08: 14:00:00 via INTRAVENOUS

## 2018-09-08 MED ORDER — PROPOFOL 500 MG/50ML IV EMUL
INTRAVENOUS | Status: AC
  Filled 2018-09-08: qty 50

## 2018-09-08 MED ORDER — LIDOCAINE 2% (20 MG/ML) 5 ML SYRINGE
INTRAMUSCULAR | Status: DC | PRN
  Administered 2018-09-08: 50 mg via INTRAVENOUS

## 2018-09-08 NOTE — H&P (Signed)
Outpatient short stay form Pre-procedure 09/08/2018 1:26 PM Lollie Sails MD  Primary Physician: Fulton Reek, MD  Reason for visit: Colonoscopy  History of present illness: Patient is a 68 year old male presenting today for colonoscopy.  He has personal history of adenomatous colon polyps.  He had a atypical polypoid lesion in his cecum.  This was biopsied and was found to be consistent with a serrated sessile adenoma.  Is returning for removal of this lesion.  Tolerated his prep well.  He takes 81 mg aspirin.  Takes no other aspirin products or blood thinning agent.   No current facility-administered medications for this encounter.   Medications Prior to Admission  Medication Sig Dispense Refill Last Dose  . acetaminophen (TYLENOL) 650 MG CR tablet Take by mouth.   Past Week at Unknown time  . aspirin EC 81 MG tablet Take by mouth.   Past Week at Unknown time  . Cholecalciferol (VITAMIN D3) 1000 units CAPS Take by mouth.   Past Week at Unknown time  . Cranberry-Vitamin C-Vitamin E 4200-20-3 MG-MG-UNIT CAPS Take by mouth.   Past Week at Unknown time  . Ginkgo Biloba 40 MG TABS Take by mouth.   Past Week at Unknown time  . levothyroxine (SYNTHROID, LEVOTHROID) 75 MCG tablet TAKE 1 TABLET ONE TIME DAILY ON AN EMPTY STOMACH WITH A GLASS OF WATER AT LEAST 30 TO 60 MINUTES BEFORE BREAKFAST   Past Week at Unknown time  . losartan (COZAAR) 25 MG tablet Take 25 mg by mouth daily.    Past Week at Unknown time  . polyethylene glycol-electrolytes (GAVILYTE-N WITH FLAVOR PACK) 420 g solution TAKE 4,000 ML ONE TIME FOR 1 DOSE.   Past Week at Unknown time  . predniSONE (DELTASONE) 10 MG tablet Take 10 mg by mouth daily with breakfast.   Past Week at Unknown time  . rosuvastatin (CRESTOR) 20 MG tablet Take by mouth.   Past Week at Unknown time  . sildenafil (REVATIO) 20 MG tablet 2-5 tablets daily as needed   Past Week at Unknown time     No Known Allergies   Past Medical History:  Diagnosis  Date  . Family history of colonic polyps   . Hyperlipidemia   . Hypertension   . Personal history of colonic polyps   . Serrated adenoma of colon   . Thyroid disease   . Thyroid disease   . Tubular adenoma of colon     Review of systems:      Physical Exam    Heart and lungs: Regular rate and rhythm without rub or gallop, lungs are bilaterally clear.    HEENT: Normocephalic atraumatic eyes are anicteric    Other:    Pertinant exam for procedure: Soft nontender nondistended bowel sounds positive normoactive    Planned proceedures: Colonoscopy and indicated procedures. I have discussed the risks benefits and complications of procedures to include not limited to bleeding, infection, perforation and the risk of sedation and the patient wishes to proceed.    Lollie Sails, MD Gastroenterology 09/08/2018  1:26 PM

## 2018-09-08 NOTE — Anesthesia Preprocedure Evaluation (Addendum)
Anesthesia Evaluation  Patient identified by MRN, date of birth, ID band Patient awake    Reviewed: Allergy & Precautions, H&P , NPO status , Patient's Chart, lab work & pertinent test results  Airway Mallampati: III  TM Distance: >3 FB     Dental  (+) Upper Dentures, Lower Dentures   Pulmonary neg pulmonary ROS, former smoker,           Cardiovascular hypertension, negative cardio ROS       Neuro/Psych negative neurological ROS  negative psych ROS   GI/Hepatic negative GI ROS, Neg liver ROS,   Endo/Other  negative endocrine ROS  Renal/GU negative Renal ROS  negative genitourinary   Musculoskeletal   Abdominal   Peds  Hematology negative hematology ROS (+)   Anesthesia Other Findings Past Medical History: No date: Family history of colonic polyps No date: Hyperlipidemia No date: Hypertension No date: Personal history of colonic polyps No date: Serrated adenoma of colon No date: Thyroid disease No date: Thyroid disease No date: Tubular adenoma of colon  Past Surgical History: No date: APPENDECTOMY No date: CAROTID ENDARTERECTOMY No date: CATARACT EXTRACTION, BILATERAL No date: COLONOSCOPY 06/09/2018: COLONOSCOPY WITH PROPOFOL; N/A     Comment:  Procedure: COLONOSCOPY WITH PROPOFOL;  Surgeon:               Lollie Sails, MD;  Location: [facility] ENDOSCOPY;                Service: Endoscopy;  Laterality: N/A; No date: EYE SURGERY No date: TONSILLECTOMY  BMI    Body Mass Index:  30.11 kg/m      Reproductive/Obstetrics negative OB ROS                            Anesthesia Physical Anesthesia Plan  ASA: II  Anesthesia Plan: General   Post-op Pain Management:    Induction:   PONV Risk Score and Plan: Propofol infusion and TIVA  Airway Management Planned: Natural Airway and Nasal Cannula  Additional Equipment:   Intra-op Plan:   Post-operative Plan:   Informed  Consent: I have reviewed the patients History and Physical, chart, labs and discussed the procedure including the risks, benefits and alternatives for the proposed anesthesia with the patient or authorized representative who has indicated his/her understanding and acceptance.   Dental Advisory Given  Plan Discussed with: Anesthesiologist, CRNA and Surgeon  Anesthesia Plan Comments:         Anesthesia Quick Evaluation

## 2018-09-08 NOTE — Anesthesia Post-op Follow-up Note (Signed)
Anesthesia QCDR form completed.        

## 2018-09-08 NOTE — Op Note (Signed)
[facility] Patient Name: Harold Harris Procedure Date: 09/08/2018 1:33 PM MRN: [id number] Account #: [id number] Date of Birth: 22-Oct-1950 Admit Type: Outpatient Age: 68 Room: [facility] ENDO ROOM 3 Gender: Male Note Status: Finalized Procedure:            Colonoscopy Indications:          Personal history of colonic polyps Providers:            Lollie Sails, MD Referring MD:         Leonie Douglas. Doy Hutching, MD (Referring MD) Medicines:            Monitored Anesthesia Care Complications:        No immediate complications. Procedure:            Pre-Anesthesia Assessment:                       - ASA Grade Assessment: II - A patient with mild                        systemic disease.                       After obtaining informed consent, the colonoscope was                        passed under direct vision. Throughout the procedure,                        the patient's blood pressure, pulse, and oxygen                        saturations were monitored continuously. The                        Colonoscope was introduced through the anus and                        advanced to the the cecum, identified by appendiceal                        orifice and ileocecal valve. The colonoscopy was                        performed without difficulty. The patient tolerated the                        procedure well. Findings:      Multiple small and large-mouthed diverticula were found in the sigmoid       colon, descending colon and transverse colon.      A 3 mm polyp was found in the cecum. The polyp was sessile. The polyp       was removed with a cold biopsy forceps. Resection and retrieval were       complete.      A 10 mm polyp was found in the cecum. The polyp was sessile. The polyp       was removed with a cold biopsy forceps. The polyp was removed with a       cold snare. The polyp was removed with a lift and cut technique using a       cold snare. The polyp was  removed with a piecemeal technique using a       cold snare. Resection and retrieval were complete. The configuration of       the base of the cecum is somewhat atypical with apparent shallow       "pockets" between the tanea.      A few medium-mouthed diverticula were found in the distal sigmoid colon.       Erythema was seen in association with the diverticular opening.       Peri-diverticular erythema was seen. Petechia were visualized in       association with the diverticular opening.      Non-bleeding external and internal hemorrhoids were found during       retroflexion, during perianal exam and during anoscopy. The hemorrhoids       were medium-sized and Grade II (internal hemorrhoids that prolapse but       reduce spontaneously). Impression:           - Diverticulosis in the sigmoid colon, in the                        descending colon and in the transverse colon.                       - One 3 mm polyp in the cecum, removed with a cold                        biopsy forceps. Resected and retrieved.                       - One 10 mm polyp in the cecum, removed with a cold                        biopsy forceps, removed with a cold snare, removed                        using lift and cut and a cold snare and removed                        piecemeal using a cold snare. Resected and retrieved.                       - Moderate diverticulosis in the distal sigmoid colon.                        Erythema was seen in association with the diverticular                        opening. Peri-diverticular erythema was seen. Petechia                        were visualized in association with the diverticular                        opening.                       - Non-bleeding external and internal hemorrhoids. Recommendation:       - Await pathology results.                       -  Repeat colonoscopy in 1 year for surveillance of                        polyps greater than 1 cm in size.                        - Await pathology results.                       - Full liquid diet today.                       - Full liquid diet for 2 days, then advance as                        tolerated to low residue diet for 3 days. Procedure Code(s):    --- Professional ---                       [phone], Colonoscopy, flexible; with removal of tumor(s),                        polyp(s), or other lesion(s) by snare technique                       45380, 31, Colonoscopy, flexible; with biopsy, single                        or multiple Diagnosis Code(s):    --- Professional ---                       K64.1, Second degree hemorrhoids                       D12.0, Benign neoplasm of cecum                       Z86.010, Personal history of colonic polyps                       K57.30, Diverticulosis of large intestine without                        perforation or abscess without bleeding CPT copyright 2018 American Medical Association. All rights reserved. The codes documented in this report are preliminary and upon coder review may  be revised to meet current compliance requirements. Lollie Sails, MD 09/08/2018 2:33:00 PM This report has been signed electronically. Number of Addenda: 0 Note Initiated On: 09/08/2018 1:33 PM Scope Withdrawal Time: 0 hours 10 minutes 27 seconds  Total Procedure Duration: 0 hours 30 minutes 42 seconds       [facility]

## 2018-09-08 NOTE — Transfer of Care (Signed)
Immediate Anesthesia Transfer of Care Note  Patient: Harold Harris  Procedure(s) Performed: COLONOSCOPY WITH PROPOFOL (N/A )  Patient Location: PACU  Anesthesia Type:General  Level of Consciousness: sedated  Airway & Oxygen Therapy: Patient Spontanous Breathing and Patient connected to nasal cannula oxygen  Post-op Assessment: Report given to RN and Post -op Vital signs reviewed and stable  Post vital signs: Reviewed and stable  Last Vitals:  Vitals Value Taken Time  BP 129/72 09/08/2018  2:31 PM  Temp 36.1 C 09/08/2018  2:31 PM  Pulse 59 09/08/2018  2:31 PM  Resp 18 09/08/2018  2:31 PM  SpO2 100 % 09/08/2018  2:31 PM  Vitals shown include unvalidated device data.  Last Pain:  Vitals:   09/08/18 1427  TempSrc: Tympanic  PainSc:          Complications: No apparent anesthesia complications

## 2018-09-09 NOTE — Anesthesia Postprocedure Evaluation (Signed)
Anesthesia Post Note  Patient: Harold Harris  Procedure(s) Performed: COLONOSCOPY WITH PROPOFOL (N/A )  Patient location during evaluation: PACU Anesthesia Type: General Level of consciousness: awake and alert Pain management: pain level controlled Vital Signs Assessment: post-procedure vital signs reviewed and stable Respiratory status: spontaneous breathing, nonlabored ventilation, respiratory function stable and patient connected to nasal cannula oxygen Cardiovascular status: blood pressure returned to baseline and stable Postop Assessment: no apparent nausea or vomiting Anesthetic complications: no     Last Vitals:  Vitals:   09/08/18 1444 09/08/18 1454  BP: 115/67 139/72  Pulse: (!) 36 (!) 37  Resp: 15 12  Temp:    SpO2: 100% 99%    Last Pain:  Vitals:   09/08/18 1454  TempSrc:   PainSc: 0-No pain                 Durenda Hurt

## 2018-09-13 LAB — SURGICAL PATHOLOGY

## 2019-02-21 DIAGNOSIS — E039 Hypothyroidism, unspecified: Secondary | ICD-10-CM | POA: Diagnosis not present

## 2019-02-21 DIAGNOSIS — Z79899 Other long term (current) drug therapy: Secondary | ICD-10-CM | POA: Diagnosis not present

## 2019-02-21 DIAGNOSIS — I739 Peripheral vascular disease, unspecified: Secondary | ICD-10-CM | POA: Diagnosis not present

## 2019-02-21 DIAGNOSIS — I10 Essential (primary) hypertension: Secondary | ICD-10-CM | POA: Diagnosis not present

## 2019-02-21 DIAGNOSIS — E782 Mixed hyperlipidemia: Secondary | ICD-10-CM | POA: Diagnosis not present

## 2019-02-21 DIAGNOSIS — R252 Cramp and spasm: Secondary | ICD-10-CM | POA: Diagnosis not present

## 2019-02-21 DIAGNOSIS — Z0000 Encounter for general adult medical examination without abnormal findings: Secondary | ICD-10-CM | POA: Diagnosis not present

## 2019-06-01 ENCOUNTER — Other Ambulatory Visit (INDEPENDENT_AMBULATORY_CARE_PROVIDER_SITE_OTHER): Payer: Self-pay | Admitting: Nurse Practitioner

## 2019-06-01 ENCOUNTER — Other Ambulatory Visit (INDEPENDENT_AMBULATORY_CARE_PROVIDER_SITE_OTHER): Payer: Self-pay | Admitting: Vascular Surgery

## 2019-06-01 ENCOUNTER — Encounter (INDEPENDENT_AMBULATORY_CARE_PROVIDER_SITE_OTHER): Payer: Self-pay

## 2019-06-01 ENCOUNTER — Ambulatory Visit (INDEPENDENT_AMBULATORY_CARE_PROVIDER_SITE_OTHER): Payer: Medicare HMO | Admitting: Nurse Practitioner

## 2019-06-01 ENCOUNTER — Other Ambulatory Visit: Payer: Self-pay

## 2019-06-01 ENCOUNTER — Ambulatory Visit (INDEPENDENT_AMBULATORY_CARE_PROVIDER_SITE_OTHER): Payer: Medicare HMO

## 2019-06-01 DIAGNOSIS — I6523 Occlusion and stenosis of bilateral carotid arteries: Secondary | ICD-10-CM | POA: Diagnosis not present

## 2019-06-01 DIAGNOSIS — I6522 Occlusion and stenosis of left carotid artery: Secondary | ICD-10-CM

## 2019-06-04 ENCOUNTER — Encounter (INDEPENDENT_AMBULATORY_CARE_PROVIDER_SITE_OTHER): Payer: Self-pay | Admitting: Vascular Surgery

## 2019-09-03 DIAGNOSIS — E782 Mixed hyperlipidemia: Secondary | ICD-10-CM | POA: Diagnosis not present

## 2019-09-03 DIAGNOSIS — E039 Hypothyroidism, unspecified: Secondary | ICD-10-CM | POA: Diagnosis not present

## 2019-09-03 DIAGNOSIS — Z87891 Personal history of nicotine dependence: Secondary | ICD-10-CM | POA: Diagnosis not present

## 2019-09-03 DIAGNOSIS — Z79899 Other long term (current) drug therapy: Secondary | ICD-10-CM | POA: Diagnosis not present

## 2019-09-03 DIAGNOSIS — Z79890 Hormone replacement therapy (postmenopausal): Secondary | ICD-10-CM | POA: Diagnosis not present

## 2019-09-03 DIAGNOSIS — I10 Essential (primary) hypertension: Secondary | ICD-10-CM | POA: Diagnosis not present

## 2019-09-03 DIAGNOSIS — I739 Peripheral vascular disease, unspecified: Secondary | ICD-10-CM | POA: Diagnosis not present

## 2019-09-03 DIAGNOSIS — Z0000 Encounter for general adult medical examination without abnormal findings: Secondary | ICD-10-CM | POA: Diagnosis not present

## 2019-09-03 DIAGNOSIS — Z125 Encounter for screening for malignant neoplasm of prostate: Secondary | ICD-10-CM | POA: Diagnosis not present

## 2019-12-03 DIAGNOSIS — I739 Peripheral vascular disease, unspecified: Secondary | ICD-10-CM | POA: Diagnosis not present

## 2019-12-03 DIAGNOSIS — Z87891 Personal history of nicotine dependence: Secondary | ICD-10-CM | POA: Diagnosis not present

## 2019-12-03 DIAGNOSIS — F4321 Adjustment disorder with depressed mood: Secondary | ICD-10-CM | POA: Diagnosis not present

## 2019-12-03 DIAGNOSIS — Z79899 Other long term (current) drug therapy: Secondary | ICD-10-CM | POA: Diagnosis not present

## 2019-12-03 DIAGNOSIS — Z7982 Long term (current) use of aspirin: Secondary | ICD-10-CM | POA: Diagnosis not present

## 2019-12-03 DIAGNOSIS — I10 Essential (primary) hypertension: Secondary | ICD-10-CM | POA: Diagnosis not present

## 2019-12-03 DIAGNOSIS — E782 Mixed hyperlipidemia: Secondary | ICD-10-CM | POA: Diagnosis not present

## 2019-12-03 DIAGNOSIS — E039 Hypothyroidism, unspecified: Secondary | ICD-10-CM | POA: Diagnosis not present

## 2020-01-08 ENCOUNTER — Other Ambulatory Visit: Payer: Self-pay | Admitting: Internal Medicine

## 2020-01-08 ENCOUNTER — Ambulatory Visit
Admission: RE | Admit: 2020-01-08 | Discharge: 2020-01-08 | Disposition: A | Discharge status: Home / Self Care | Payer: Medicare HMO | Source: Ambulatory Visit | Attending: Internal Medicine | Admitting: Internal Medicine

## 2020-01-08 ENCOUNTER — Other Ambulatory Visit: Payer: Self-pay

## 2020-01-08 DIAGNOSIS — R202 Paresthesia of skin: Secondary | ICD-10-CM

## 2020-01-08 DIAGNOSIS — I639 Cerebral infarction, unspecified: Secondary | ICD-10-CM

## 2020-01-08 DIAGNOSIS — Z8673 Personal history of transient ischemic attack (TIA), and cerebral infarction without residual deficits: Secondary | ICD-10-CM | POA: Diagnosis not present

## 2020-01-08 DIAGNOSIS — Z7982 Long term (current) use of aspirin: Secondary | ICD-10-CM | POA: Diagnosis not present

## 2020-01-08 DIAGNOSIS — G4700 Insomnia, unspecified: Secondary | ICD-10-CM | POA: Diagnosis not present

## 2020-01-08 DIAGNOSIS — I6522 Occlusion and stenosis of left carotid artery: Secondary | ICD-10-CM | POA: Diagnosis not present

## 2020-01-08 DIAGNOSIS — Z87891 Personal history of nicotine dependence: Secondary | ICD-10-CM | POA: Diagnosis not present

## 2020-01-08 DIAGNOSIS — I6381 Other cerebral infarction due to occlusion or stenosis of small artery: Secondary | ICD-10-CM

## 2020-01-08 DIAGNOSIS — Z79899 Other long term (current) drug therapy: Secondary | ICD-10-CM | POA: Diagnosis not present

## 2020-01-08 DIAGNOSIS — E782 Mixed hyperlipidemia: Secondary | ICD-10-CM | POA: Diagnosis not present

## 2020-01-15 ENCOUNTER — Ambulatory Visit
Admission: RE | Admit: 2020-01-15 | Discharge: 2020-01-15 | Disposition: A | Discharge status: Home / Self Care | Payer: Medicare HMO | Source: Ambulatory Visit | Attending: Internal Medicine | Admitting: Internal Medicine

## 2020-01-15 ENCOUNTER — Other Ambulatory Visit: Payer: Self-pay

## 2020-01-15 DIAGNOSIS — I6381 Other cerebral infarction due to occlusion or stenosis of small artery: Secondary | ICD-10-CM

## 2020-01-15 DIAGNOSIS — R202 Paresthesia of skin: Secondary | ICD-10-CM | POA: Diagnosis not present

## 2020-01-15 DIAGNOSIS — I639 Cerebral infarction, unspecified: Secondary | ICD-10-CM | POA: Diagnosis not present

## 2020-01-15 DIAGNOSIS — I6523 Occlusion and stenosis of bilateral carotid arteries: Secondary | ICD-10-CM | POA: Diagnosis not present

## 2020-01-16 DIAGNOSIS — Z87891 Personal history of nicotine dependence: Secondary | ICD-10-CM | POA: Diagnosis not present

## 2020-01-16 DIAGNOSIS — R002 Palpitations: Secondary | ICD-10-CM | POA: Diagnosis not present

## 2020-01-16 DIAGNOSIS — I10 Essential (primary) hypertension: Secondary | ICD-10-CM | POA: Diagnosis not present

## 2020-01-16 DIAGNOSIS — G464 Cerebellar stroke syndrome: Secondary | ICD-10-CM | POA: Diagnosis not present

## 2020-01-16 DIAGNOSIS — Z8673 Personal history of transient ischemic attack (TIA), and cerebral infarction without residual deficits: Secondary | ICD-10-CM | POA: Diagnosis not present

## 2020-01-16 DIAGNOSIS — I639 Cerebral infarction, unspecified: Secondary | ICD-10-CM | POA: Diagnosis not present

## 2020-01-16 DIAGNOSIS — Z7902 Long term (current) use of antithrombotics/antiplatelets: Secondary | ICD-10-CM | POA: Diagnosis not present

## 2020-01-24 DIAGNOSIS — I639 Cerebral infarction, unspecified: Secondary | ICD-10-CM | POA: Diagnosis not present

## 2020-01-29 DIAGNOSIS — I639 Cerebral infarction, unspecified: Secondary | ICD-10-CM | POA: Diagnosis not present

## 2020-03-07 DIAGNOSIS — E039 Hypothyroidism, unspecified: Secondary | ICD-10-CM | POA: Diagnosis not present

## 2020-03-07 DIAGNOSIS — E782 Mixed hyperlipidemia: Secondary | ICD-10-CM | POA: Diagnosis not present

## 2020-03-07 DIAGNOSIS — Z79899 Other long term (current) drug therapy: Secondary | ICD-10-CM | POA: Diagnosis not present

## 2020-03-07 DIAGNOSIS — I10 Essential (primary) hypertension: Secondary | ICD-10-CM | POA: Diagnosis not present

## 2020-03-14 DIAGNOSIS — I10 Essential (primary) hypertension: Secondary | ICD-10-CM | POA: Diagnosis not present

## 2020-03-14 DIAGNOSIS — G479 Sleep disorder, unspecified: Secondary | ICD-10-CM | POA: Diagnosis not present

## 2020-03-14 DIAGNOSIS — I739 Peripheral vascular disease, unspecified: Secondary | ICD-10-CM | POA: Diagnosis not present

## 2020-03-14 DIAGNOSIS — Z87891 Personal history of nicotine dependence: Secondary | ICD-10-CM | POA: Diagnosis not present

## 2020-03-14 DIAGNOSIS — Z8673 Personal history of transient ischemic attack (TIA), and cerebral infarction without residual deficits: Secondary | ICD-10-CM | POA: Diagnosis not present

## 2020-03-14 DIAGNOSIS — E785 Hyperlipidemia, unspecified: Secondary | ICD-10-CM | POA: Diagnosis not present

## 2020-03-14 DIAGNOSIS — E079 Disorder of thyroid, unspecified: Secondary | ICD-10-CM | POA: Diagnosis not present

## 2020-03-14 DIAGNOSIS — Z0000 Encounter for general adult medical examination without abnormal findings: Secondary | ICD-10-CM | POA: Diagnosis not present

## 2020-04-25 DIAGNOSIS — S0502XA Injury of conjunctiva and corneal abrasion without foreign body, left eye, initial encounter: Secondary | ICD-10-CM | POA: Diagnosis not present

## 2020-05-30 ENCOUNTER — Encounter (INDEPENDENT_AMBULATORY_CARE_PROVIDER_SITE_OTHER): Payer: Medicare HMO

## 2020-05-30 ENCOUNTER — Ambulatory Visit (INDEPENDENT_AMBULATORY_CARE_PROVIDER_SITE_OTHER): Payer: Medicare HMO | Admitting: Vascular Surgery

## 2020-06-16 DIAGNOSIS — E039 Hypothyroidism, unspecified: Secondary | ICD-10-CM | POA: Diagnosis not present

## 2020-06-16 DIAGNOSIS — G4700 Insomnia, unspecified: Secondary | ICD-10-CM | POA: Diagnosis not present

## 2020-06-16 DIAGNOSIS — I10 Essential (primary) hypertension: Secondary | ICD-10-CM | POA: Diagnosis not present

## 2020-06-16 DIAGNOSIS — E782 Mixed hyperlipidemia: Secondary | ICD-10-CM | POA: Diagnosis not present

## 2020-08-04 DIAGNOSIS — M5413 Radiculopathy, cervicothoracic region: Secondary | ICD-10-CM | POA: Diagnosis not present

## 2020-08-04 DIAGNOSIS — M50323 Other cervical disc degeneration at C6-C7 level: Secondary | ICD-10-CM | POA: Diagnosis not present

## 2020-08-04 DIAGNOSIS — M542 Cervicalgia: Secondary | ICD-10-CM | POA: Diagnosis not present

## 2020-08-04 DIAGNOSIS — M7918 Myalgia, other site: Secondary | ICD-10-CM | POA: Diagnosis not present

## 2020-08-04 DIAGNOSIS — M50322 Other cervical disc degeneration at C5-C6 level: Secondary | ICD-10-CM | POA: Diagnosis not present

## 2020-08-04 DIAGNOSIS — R202 Paresthesia of skin: Secondary | ICD-10-CM | POA: Diagnosis not present

## 2020-08-04 DIAGNOSIS — M9901 Segmental and somatic dysfunction of cervical region: Secondary | ICD-10-CM | POA: Diagnosis not present

## 2020-08-11 DIAGNOSIS — M7918 Myalgia, other site: Secondary | ICD-10-CM | POA: Diagnosis not present

## 2020-08-11 DIAGNOSIS — M542 Cervicalgia: Secondary | ICD-10-CM | POA: Diagnosis not present

## 2020-08-11 DIAGNOSIS — R202 Paresthesia of skin: Secondary | ICD-10-CM | POA: Diagnosis not present

## 2020-08-11 DIAGNOSIS — M5413 Radiculopathy, cervicothoracic region: Secondary | ICD-10-CM | POA: Diagnosis not present

## 2020-08-11 DIAGNOSIS — M9901 Segmental and somatic dysfunction of cervical region: Secondary | ICD-10-CM | POA: Diagnosis not present

## 2020-08-11 DIAGNOSIS — M50322 Other cervical disc degeneration at C5-C6 level: Secondary | ICD-10-CM | POA: Diagnosis not present

## 2020-08-11 DIAGNOSIS — M50323 Other cervical disc degeneration at C6-C7 level: Secondary | ICD-10-CM | POA: Diagnosis not present

## 2020-09-24 DIAGNOSIS — Z125 Encounter for screening for malignant neoplasm of prostate: Secondary | ICD-10-CM | POA: Diagnosis not present

## 2020-09-24 DIAGNOSIS — I10 Essential (primary) hypertension: Secondary | ICD-10-CM | POA: Diagnosis not present

## 2020-09-24 DIAGNOSIS — Z79899 Other long term (current) drug therapy: Secondary | ICD-10-CM | POA: Diagnosis not present

## 2020-09-24 DIAGNOSIS — E782 Mixed hyperlipidemia: Secondary | ICD-10-CM | POA: Diagnosis not present

## 2020-09-24 DIAGNOSIS — E039 Hypothyroidism, unspecified: Secondary | ICD-10-CM | POA: Diagnosis not present

## 2020-10-28 DIAGNOSIS — N289 Disorder of kidney and ureter, unspecified: Secondary | ICD-10-CM | POA: Diagnosis not present

## 2020-11-17 DIAGNOSIS — M79672 Pain in left foot: Secondary | ICD-10-CM | POA: Diagnosis not present

## 2020-11-17 DIAGNOSIS — M722 Plantar fascial fibromatosis: Secondary | ICD-10-CM | POA: Diagnosis not present

## 2020-12-08 DIAGNOSIS — M722 Plantar fascial fibromatosis: Secondary | ICD-10-CM | POA: Diagnosis not present

## 2021-01-01 DIAGNOSIS — M5413 Radiculopathy, cervicothoracic region: Secondary | ICD-10-CM | POA: Diagnosis not present

## 2021-01-01 DIAGNOSIS — R202 Paresthesia of skin: Secondary | ICD-10-CM | POA: Diagnosis not present

## 2021-01-01 DIAGNOSIS — M9901 Segmental and somatic dysfunction of cervical region: Secondary | ICD-10-CM | POA: Diagnosis not present

## 2021-01-01 DIAGNOSIS — M50322 Other cervical disc degeneration at C5-C6 level: Secondary | ICD-10-CM | POA: Diagnosis not present

## 2021-01-01 DIAGNOSIS — M542 Cervicalgia: Secondary | ICD-10-CM | POA: Diagnosis not present

## 2021-01-05 DIAGNOSIS — M542 Cervicalgia: Secondary | ICD-10-CM | POA: Diagnosis not present

## 2021-01-05 DIAGNOSIS — M9901 Segmental and somatic dysfunction of cervical region: Secondary | ICD-10-CM | POA: Diagnosis not present

## 2021-01-05 DIAGNOSIS — R202 Paresthesia of skin: Secondary | ICD-10-CM | POA: Diagnosis not present

## 2021-01-05 DIAGNOSIS — M5413 Radiculopathy, cervicothoracic region: Secondary | ICD-10-CM | POA: Diagnosis not present

## 2021-01-08 DIAGNOSIS — M9901 Segmental and somatic dysfunction of cervical region: Secondary | ICD-10-CM | POA: Diagnosis not present

## 2021-01-08 DIAGNOSIS — M7918 Myalgia, other site: Secondary | ICD-10-CM | POA: Diagnosis not present

## 2021-01-08 DIAGNOSIS — M50322 Other cervical disc degeneration at C5-C6 level: Secondary | ICD-10-CM | POA: Diagnosis not present

## 2021-01-08 DIAGNOSIS — M5413 Radiculopathy, cervicothoracic region: Secondary | ICD-10-CM | POA: Diagnosis not present

## 2021-01-08 DIAGNOSIS — M542 Cervicalgia: Secondary | ICD-10-CM | POA: Diagnosis not present

## 2021-01-08 DIAGNOSIS — R202 Paresthesia of skin: Secondary | ICD-10-CM | POA: Diagnosis not present

## 2021-01-08 DIAGNOSIS — M50323 Other cervical disc degeneration at C6-C7 level: Secondary | ICD-10-CM | POA: Diagnosis not present

## 2021-01-19 DIAGNOSIS — I10 Essential (primary) hypertension: Secondary | ICD-10-CM | POA: Diagnosis not present

## 2021-01-19 DIAGNOSIS — R202 Paresthesia of skin: Secondary | ICD-10-CM | POA: Diagnosis not present

## 2021-01-23 ENCOUNTER — Other Ambulatory Visit: Payer: Self-pay | Admitting: Sports Medicine

## 2021-01-23 DIAGNOSIS — M62838 Other muscle spasm: Secondary | ICD-10-CM | POA: Diagnosis not present

## 2021-01-23 DIAGNOSIS — R202 Paresthesia of skin: Secondary | ICD-10-CM | POA: Diagnosis not present

## 2021-01-23 DIAGNOSIS — R29898 Other symptoms and signs involving the musculoskeletal system: Secondary | ICD-10-CM | POA: Diagnosis not present

## 2021-01-23 DIAGNOSIS — R200 Anesthesia of skin: Secondary | ICD-10-CM

## 2021-01-23 DIAGNOSIS — M62541 Muscle wasting and atrophy, not elsewhere classified, right hand: Secondary | ICD-10-CM

## 2021-01-23 DIAGNOSIS — M5030 Other cervical disc degeneration, unspecified cervical region: Secondary | ICD-10-CM | POA: Diagnosis not present

## 2021-01-23 DIAGNOSIS — M542 Cervicalgia: Secondary | ICD-10-CM | POA: Diagnosis not present

## 2021-01-23 DIAGNOSIS — M25521 Pain in right elbow: Secondary | ICD-10-CM | POA: Diagnosis not present

## 2021-01-27 ENCOUNTER — Ambulatory Visit
Admission: RE | Admit: 2021-01-27 | Discharge: 2021-01-27 | Disposition: A | Discharge status: Home / Self Care | Payer: Medicare HMO | Source: Ambulatory Visit | Attending: Sports Medicine | Admitting: Sports Medicine

## 2021-01-27 DIAGNOSIS — R29898 Other symptoms and signs involving the musculoskeletal system: Secondary | ICD-10-CM | POA: Diagnosis not present

## 2021-01-27 DIAGNOSIS — R202 Paresthesia of skin: Secondary | ICD-10-CM | POA: Insufficient documentation

## 2021-01-27 DIAGNOSIS — M5030 Other cervical disc degeneration, unspecified cervical region: Secondary | ICD-10-CM | POA: Insufficient documentation

## 2021-01-27 DIAGNOSIS — M62541 Muscle wasting and atrophy, not elsewhere classified, right hand: Secondary | ICD-10-CM | POA: Insufficient documentation

## 2021-01-27 DIAGNOSIS — R531 Weakness: Secondary | ICD-10-CM | POA: Diagnosis not present

## 2021-01-27 DIAGNOSIS — M4802 Spinal stenosis, cervical region: Secondary | ICD-10-CM | POA: Diagnosis not present

## 2021-01-27 DIAGNOSIS — R200 Anesthesia of skin: Secondary | ICD-10-CM | POA: Diagnosis not present

## 2021-01-27 DIAGNOSIS — M50221 Other cervical disc displacement at C4-C5 level: Secondary | ICD-10-CM | POA: Diagnosis not present

## 2021-01-28 DIAGNOSIS — R202 Paresthesia of skin: Secondary | ICD-10-CM | POA: Diagnosis not present

## 2021-01-28 DIAGNOSIS — R200 Anesthesia of skin: Secondary | ICD-10-CM | POA: Diagnosis not present

## 2021-02-13 ENCOUNTER — Other Ambulatory Visit: Payer: Self-pay | Admitting: Surgery

## 2021-02-16 DIAGNOSIS — I739 Peripheral vascular disease, unspecified: Secondary | ICD-10-CM | POA: Diagnosis not present

## 2021-02-16 DIAGNOSIS — E785 Hyperlipidemia, unspecified: Secondary | ICD-10-CM | POA: Diagnosis not present

## 2021-02-16 DIAGNOSIS — E079 Disorder of thyroid, unspecified: Secondary | ICD-10-CM | POA: Diagnosis not present

## 2021-02-16 DIAGNOSIS — Z125 Encounter for screening for malignant neoplasm of prostate: Secondary | ICD-10-CM | POA: Diagnosis not present

## 2021-02-16 DIAGNOSIS — Z0000 Encounter for general adult medical examination without abnormal findings: Secondary | ICD-10-CM | POA: Diagnosis not present

## 2021-02-16 DIAGNOSIS — I10 Essential (primary) hypertension: Secondary | ICD-10-CM | POA: Diagnosis not present

## 2021-02-16 DIAGNOSIS — Z79899 Other long term (current) drug therapy: Secondary | ICD-10-CM | POA: Diagnosis not present

## 2021-02-19 ENCOUNTER — Encounter
Admission: RE | Admit: 2021-02-19 | Discharge: 2021-02-19 | Disposition: A | Discharge status: Home / Self Care | Payer: Medicare HMO | Source: Ambulatory Visit | Attending: Surgery | Admitting: Surgery

## 2021-02-19 ENCOUNTER — Other Ambulatory Visit: Payer: Self-pay

## 2021-02-19 HISTORY — DX: Peripheral vascular disease, unspecified: I73.9

## 2021-02-19 HISTORY — DX: Unspecified osteoarthritis, unspecified site: M19.90

## 2021-02-19 HISTORY — DX: Cerebral infarction, unspecified: I63.9

## 2021-02-19 HISTORY — DX: Hypothyroidism, unspecified: E03.9

## 2021-02-19 HISTORY — DX: Gastro-esophageal reflux disease without esophagitis: K21.9

## 2021-02-19 HISTORY — DX: Personal history of urinary calculi: Z87.442

## 2021-02-19 HISTORY — DX: Other complications of anesthesia, initial encounter: T88.59XA

## 2021-02-19 HISTORY — DX: Cerebral infarction due to unspecified occlusion or stenosis of unspecified carotid artery: I63.239

## 2021-02-19 NOTE — Patient Instructions (Addendum)
Your procedure is scheduled on:02-25-21 WEDNESDAY Report to the Registration Desk on the 1st floor of the [facility]-Then proceed to the 2nd floor Surgery Desk in the [facility] To find out your arrival time, please call [phone] between 1PM - 3PM on:02-24-21 TUESDAY  REMEMBER: Instructions that are not followed completely may result in serious medical risk, up to and including death; or upon the discretion of your surgeon and anesthesiologist your surgery may need to be rescheduled.  Do not eat food after midnight the night before surgery.  No gum chewing, lozengers or hard candies.  You may however, drink CLEAR liquids up to 2 hours before you are scheduled to arrive for your surgery. Do not drink anything within 2 hours of your scheduled arrival time.  Clear liquids include: - water  - apple juice without pulp - gatorade  - black coffee or tea (Do NOT add milk or creamers to the coffee or tea) Do NOT drink anything that is not on this list.  In addition, your doctor has ordered for you to drink the provided  Ensure Pre-Surgery Clear Carbohydrate Drink  Drinking this carbohydrate drink up to two hours before surgery helps to reduce insulin resistance and improve patient outcomes. Please complete drinking 2 hours prior to scheduled arrival time.  TAKE THESE MEDICATIONS THE MORNING OF SURGERY WITH A SIP OF WATER: -Amlodipine (Norvasc) -Crestor (Rosuvastatin) -Synthroid (Levothyroxine)  Stop your Plavix (Clopidogrel) as instructed by Dr Doy Hutching and Dr. Wannetta Sender dose of Plavix will be on 02-20-21 Friday  One week prior to surgery: Stop Anti-inflammatories (NSAIDS) such as Advil, Aleve, Ibuprofen, Motrin, Naproxen, Naprosyn and Aspirin based products such as Excedrin, Goodys Powder, BC Powder.You may however, continue to take Tylenol if needed for pain up until the day of surgery.  Stop ANY OVER THE COUNTER supplements/vitamins NOW (02-19-21) until after surgery.  No Alcohol  for 24 hours before or after surgery.  No Smoking including e-cigarettes for 24 hours prior to surgery.  No chewable tobacco products for at least 6 hours prior to surgery.  No nicotine patches on the day of surgery.  Do not use any "recreational" drugs for at least a week prior to your surgery.  Please be advised that the combination of cocaine and anesthesia may have negative outcomes, up to and including death. If you test positive for cocaine, your surgery will be cancelled.  On the morning of surgery brush your teeth with toothpaste and water, you may rinse your mouth with mouthwash if you wish. Do not swallow any toothpaste or mouthwash.  Do not wear jewelry, make-up, hairpins, clips or nail polish.  Do not wear lotions, powders, or perfumes.   Do not shave body from the neck down 48 hours prior to surgery just in case you cut yourself which could leave a site for infection.  Also, freshly shaved skin may become irritated if using the CHG soap.  Contact lenses, hearing aids and dentures may not be worn into surgery.  Do not bring valuables to the hospital. [facility] is not responsible for any missing/lost belongings or valuables.   Use CHG Soap as directed on instruction sheet.  Notify your doctor if there is any change in your medical condition (cold, fever, infection).  Wear comfortable clothing (specific to your surgery type) to the hospital.  After surgery, you can help prevent lung complications by doing breathing exercises.  Take deep breaths and cough every 1-2 hours. Your doctor may order a device called an Incentive  Spirometer to help you take deep breaths. When coughing or sneezing, hold a pillow firmly against your incision with both hands. This is called "splinting." Doing this helps protect your incision. It also decreases belly discomfort.  If you are being admitted to the hospital overnight, leave your suitcase in the car. After surgery it may be brought to  your room.  If you are being discharged the day of surgery, you will not be allowed to drive home. You will need a responsible adult (18 years or older) to drive you home and stay with you that night.   If you are taking public transportation, you will need to have a responsible adult (18 years or older) with you. Please confirm with your physician that it is acceptable to use public transportation.   Please call the [facility] at [phone] if you have any questions about these instructions.  Surgery Visitation Policy:  Patients undergoing a surgery or procedure may have one family member or support person with them as long as that person is not COVID-19 positive or experiencing its symptoms.  That person may remain in the waiting area during the procedure.  Inpatient Visitation:    Visiting hours are 7 a.m. to 8 p.m. Inpatients will be allowed two visitors daily. The visitors may change each day during the patient's stay. No visitors under the age of 70. Any visitor under the age of 65 must be accompanied by an adult. The visitor must pass COVID-19 screenings, use hand sanitizer when entering and exiting the patient's room and wear a mask at all times, including in the patient's room. Patients must also wear a mask when staff or their visitor are in the room. Masking is required regardless of vaccination status.

## 2021-02-20 ENCOUNTER — Encounter
Admission: RE | Admit: 2021-02-20 | Discharge: 2021-02-20 | Disposition: A | Discharge status: Home / Self Care | Payer: Medicare HMO | Source: Ambulatory Visit | Attending: Surgery | Admitting: Surgery

## 2021-02-20 DIAGNOSIS — I10 Essential (primary) hypertension: Secondary | ICD-10-CM | POA: Insufficient documentation

## 2021-02-20 DIAGNOSIS — I491 Atrial premature depolarization: Secondary | ICD-10-CM | POA: Diagnosis not present

## 2021-02-20 DIAGNOSIS — Z01818 Encounter for other preprocedural examination: Secondary | ICD-10-CM | POA: Diagnosis not present

## 2021-02-23 DIAGNOSIS — M9901 Segmental and somatic dysfunction of cervical region: Secondary | ICD-10-CM | POA: Diagnosis not present

## 2021-02-23 DIAGNOSIS — M542 Cervicalgia: Secondary | ICD-10-CM | POA: Diagnosis not present

## 2021-02-23 DIAGNOSIS — M5441 Lumbago with sciatica, right side: Secondary | ICD-10-CM | POA: Diagnosis not present

## 2021-02-23 DIAGNOSIS — M50322 Other cervical disc degeneration at C5-C6 level: Secondary | ICD-10-CM | POA: Diagnosis not present

## 2021-02-23 DIAGNOSIS — M5413 Radiculopathy, cervicothoracic region: Secondary | ICD-10-CM | POA: Diagnosis not present

## 2021-02-23 DIAGNOSIS — R202 Paresthesia of skin: Secondary | ICD-10-CM | POA: Diagnosis not present

## 2021-02-23 DIAGNOSIS — M9904 Segmental and somatic dysfunction of sacral region: Secondary | ICD-10-CM | POA: Diagnosis not present

## 2021-02-23 DIAGNOSIS — M5451 Vertebrogenic low back pain: Secondary | ICD-10-CM | POA: Diagnosis not present

## 2021-02-23 DIAGNOSIS — M9903 Segmental and somatic dysfunction of lumbar region: Secondary | ICD-10-CM | POA: Diagnosis not present

## 2021-02-24 DIAGNOSIS — M5441 Lumbago with sciatica, right side: Secondary | ICD-10-CM | POA: Diagnosis not present

## 2021-02-24 DIAGNOSIS — M542 Cervicalgia: Secondary | ICD-10-CM | POA: Diagnosis not present

## 2021-02-24 DIAGNOSIS — M9901 Segmental and somatic dysfunction of cervical region: Secondary | ICD-10-CM | POA: Diagnosis not present

## 2021-02-24 DIAGNOSIS — M5413 Radiculopathy, cervicothoracic region: Secondary | ICD-10-CM | POA: Diagnosis not present

## 2021-02-24 DIAGNOSIS — M9904 Segmental and somatic dysfunction of sacral region: Secondary | ICD-10-CM | POA: Diagnosis not present

## 2021-02-24 DIAGNOSIS — M9903 Segmental and somatic dysfunction of lumbar region: Secondary | ICD-10-CM | POA: Diagnosis not present

## 2021-02-24 DIAGNOSIS — M50322 Other cervical disc degeneration at C5-C6 level: Secondary | ICD-10-CM | POA: Diagnosis not present

## 2021-02-24 DIAGNOSIS — M50323 Other cervical disc degeneration at C6-C7 level: Secondary | ICD-10-CM | POA: Diagnosis not present

## 2021-02-24 DIAGNOSIS — M5451 Vertebrogenic low back pain: Secondary | ICD-10-CM | POA: Diagnosis not present

## 2021-02-24 MED ORDER — CHLORHEXIDINE GLUCONATE 0.12 % MT SOLN: 15.0000 mL | Freq: Once | OROMUCOSAL | Status: AC

## 2021-02-24 MED ORDER — LACTATED RINGERS IV SOLN: INTRAVENOUS | Status: DC

## 2021-02-24 MED ORDER — CEFAZOLIN SODIUM-DEXTROSE 2-4 GM/100ML-% IV SOLN
2.0000 g | INTRAVENOUS | Status: AC
  Administered 2021-02-25: 2 g via INTRAVENOUS

## 2021-02-24 MED ORDER — ORAL CARE MOUTH RINSE: 15.0000 mL | Freq: Once | OROMUCOSAL | Status: AC

## 2021-02-24 MED ORDER — FAMOTIDINE 20 MG PO TABS: 20.0000 mg | ORAL_TABLET | Freq: Once | ORAL | Status: AC

## 2021-02-25 ENCOUNTER — Ambulatory Visit: Payer: Medicare HMO | Admitting: Urgent Care

## 2021-02-25 ENCOUNTER — Other Ambulatory Visit: Payer: Self-pay

## 2021-02-25 ENCOUNTER — Ambulatory Visit
Admission: RE | Admit: 2021-02-25 | Discharge: 2021-02-25 | Disposition: A | Discharge status: Home / Self Care | Payer: Medicare HMO | Attending: Surgery | Admitting: Surgery

## 2021-02-25 ENCOUNTER — Encounter: Payer: Self-pay | Admitting: Surgery

## 2021-02-25 ENCOUNTER — Encounter
Admission: RE | Disposition: A | Discharge status: Home / Self Care | Payer: Self-pay | Source: Home / Self Care | Attending: Surgery

## 2021-02-25 DIAGNOSIS — R200 Anesthesia of skin: Secondary | ICD-10-CM | POA: Diagnosis not present

## 2021-02-25 DIAGNOSIS — Z79899 Other long term (current) drug therapy: Secondary | ICD-10-CM | POA: Insufficient documentation

## 2021-02-25 DIAGNOSIS — G5621 Lesion of ulnar nerve, right upper limb: Secondary | ICD-10-CM | POA: Insufficient documentation

## 2021-02-25 DIAGNOSIS — Z8673 Personal history of transient ischemic attack (TIA), and cerebral infarction without residual deficits: Secondary | ICD-10-CM | POA: Diagnosis not present

## 2021-02-25 DIAGNOSIS — R202 Paresthesia of skin: Secondary | ICD-10-CM | POA: Diagnosis not present

## 2021-02-25 DIAGNOSIS — I252 Old myocardial infarction: Secondary | ICD-10-CM | POA: Insufficient documentation

## 2021-02-25 DIAGNOSIS — Z7902 Long term (current) use of antithrombotics/antiplatelets: Secondary | ICD-10-CM | POA: Diagnosis not present

## 2021-02-25 DIAGNOSIS — M62541 Muscle wasting and atrophy, not elsewhere classified, right hand: Secondary | ICD-10-CM | POA: Diagnosis not present

## 2021-02-25 HISTORY — PX: ULNAR NERVE TRANSPOSITION: SHX2595

## 2021-02-25 SURGERY — ULNAR NERVE DECOMPRESSION/TRANSPOSITION
Anesthesia: General | Site: Elbow | Laterality: Right

## 2021-02-25 MED ORDER — CEFAZOLIN SODIUM-DEXTROSE 2-4 GM/100ML-% IV SOLN
INTRAVENOUS | Status: AC
  Filled 2021-02-25: qty 100

## 2021-02-25 MED ORDER — MIDAZOLAM HCL 2 MG/2ML IJ SOLN
INTRAMUSCULAR | Status: AC
  Filled 2021-02-25: qty 2

## 2021-02-25 MED ORDER — PHENYLEPHRINE HCL (PRESSORS) 10 MG/ML IV SOLN
INTRAVENOUS | Status: DC | PRN
  Administered 2021-02-25 (×6): 100 ug via INTRAVENOUS

## 2021-02-25 MED ORDER — CHLORHEXIDINE GLUCONATE 0.12 % MT SOLN
OROMUCOSAL | Status: AC
  Administered 2021-02-25: 15 mL via OROMUCOSAL
  Filled 2021-02-25: qty 15

## 2021-02-25 MED ORDER — LIDOCAINE HCL (CARDIAC) PF 100 MG/5ML IV SOSY
PREFILLED_SYRINGE | INTRAVENOUS | Status: DC | PRN
  Administered 2021-02-25: 100 mg via INTRAVENOUS

## 2021-02-25 MED ORDER — FENTANYL CITRATE (PF) 100 MCG/2ML IJ SOLN
INTRAMUSCULAR | Status: AC
  Filled 2021-02-25: qty 2

## 2021-02-25 MED ORDER — PROPOFOL 10 MG/ML IV BOLUS
INTRAVENOUS | Status: DC | PRN
  Administered 2021-02-25: 200 mg via INTRAVENOUS

## 2021-02-25 MED ORDER — HYDROCODONE-ACETAMINOPHEN 5-325 MG PO TABS
ORAL_TABLET | ORAL | Status: AC
  Administered 2021-02-25: 2 via ORAL
  Filled 2021-02-25: qty 2

## 2021-02-25 MED ORDER — ONDANSETRON HCL 4 MG/2ML IJ SOLN: 4.0000 mg | Freq: Once | INTRAMUSCULAR | Status: DC | PRN

## 2021-02-25 MED ORDER — SEVOFLURANE IN SOLN
RESPIRATORY_TRACT | Status: AC
  Filled 2021-02-25: qty 250

## 2021-02-25 MED ORDER — FAMOTIDINE 20 MG PO TABS
ORAL_TABLET | ORAL | Status: AC
  Administered 2021-02-25: 20 mg via ORAL
  Filled 2021-02-25: qty 1

## 2021-02-25 MED ORDER — ACETAMINOPHEN 10 MG/ML IV SOLN
INTRAVENOUS | Status: DC | PRN
  Administered 2021-02-25: 1000 mg via INTRAVENOUS

## 2021-02-25 MED ORDER — FENTANYL CITRATE (PF) 100 MCG/2ML IJ SOLN: 25.0000 ug | INTRAMUSCULAR | Status: DC | PRN

## 2021-02-25 MED ORDER — FENTANYL CITRATE (PF) 100 MCG/2ML IJ SOLN
INTRAMUSCULAR | Status: DC | PRN
  Administered 2021-02-25 (×2): 50 ug via INTRAVENOUS

## 2021-02-25 MED ORDER — PROPOFOL 10 MG/ML IV BOLUS
INTRAVENOUS | Status: AC
  Filled 2021-02-25: qty 20

## 2021-02-25 MED ORDER — DEXAMETHASONE SODIUM PHOSPHATE 10 MG/ML IJ SOLN
INTRAMUSCULAR | Status: DC | PRN
  Administered 2021-02-25: 10 mg via INTRAVENOUS

## 2021-02-25 MED ORDER — HYDROCODONE-ACETAMINOPHEN 5-325 MG PO TABS: 2.0000 | ORAL_TABLET | Freq: Four times a day (QID) | ORAL | Status: DC | PRN

## 2021-02-25 MED ORDER — ONDANSETRON HCL 4 MG/2ML IJ SOLN
INTRAMUSCULAR | Status: DC | PRN
  Administered 2021-02-25: 4 mg via INTRAVENOUS

## 2021-02-25 MED ORDER — EPHEDRINE SULFATE 50 MG/ML IJ SOLN
INTRAMUSCULAR | Status: DC | PRN
  Administered 2021-02-25 (×2): 10 mg via INTRAVENOUS

## 2021-02-25 MED ORDER — HYDROCODONE-ACETAMINOPHEN 5-325 MG PO TABS: 1.0000 | ORAL_TABLET | Freq: Four times a day (QID) | ORAL | 0 refills | Status: DC | PRN

## 2021-02-25 MED ORDER — MIDAZOLAM HCL 2 MG/2ML IJ SOLN
INTRAMUSCULAR | Status: DC | PRN
  Administered 2021-02-25: 2 mg via INTRAVENOUS

## 2021-02-25 MED ORDER — BUPIVACAINE HCL (PF) 0.5 % IJ SOLN
INTRAMUSCULAR | Status: DC | PRN
  Administered 2021-02-25: 20 mL

## 2021-02-25 SURGICAL SUPPLY — 39 items
APL PRP STRL LF DISP 70% ISPRP (MISCELLANEOUS) ×2
BNDG CMPR STD VLCR NS LF 5.8X4 (GAUZE/BANDAGES/DRESSINGS) ×1
BNDG COHESIVE 4X5 TAN STRL (GAUZE/BANDAGES/DRESSINGS) ×2 IMPLANT
BNDG ELASTIC 4X5.8 VLCR NS LF (GAUZE/BANDAGES/DRESSINGS) ×2 IMPLANT
BNDG ESMARK 4X12 TAN STRL LF (GAUZE/BANDAGES/DRESSINGS) ×2 IMPLANT
CANISTER SUCT 1200ML W/VALVE (MISCELLANEOUS) ×2 IMPLANT
CHLORAPREP W/TINT 26 (MISCELLANEOUS) ×4 IMPLANT
CORD BIP STRL DISP 12FT (MISCELLANEOUS) ×2 IMPLANT
COVER WAND RF STERILE (DRAPES) ×2 IMPLANT
CUFF TOURN SGL QUICK 18X4 (TOURNIQUET CUFF) IMPLANT
CUFF TOURN SGL QUICK 24 (TOURNIQUET CUFF)
CUFF TRNQT CYL 24X4X16.5-23 (TOURNIQUET CUFF) IMPLANT
ELECT REM PT RETURN 9FT ADLT (ELECTROSURGICAL) ×2
ELECTRODE REM PT RTRN 9FT ADLT (ELECTROSURGICAL) ×1 IMPLANT
FORCEPS JEWEL BIP 4-3/4 STR (INSTRUMENTS) ×2 IMPLANT
GAUZE SPONGE 4X4 12PLY STRL (GAUZE/BANDAGES/DRESSINGS) ×2 IMPLANT
GAUZE XEROFORM 1X8 LF (GAUZE/BANDAGES/DRESSINGS) ×2 IMPLANT
GLOVE SURG UNDER LTX SZ8 (GLOVE) ×2 IMPLANT
GOWN STRL REUS W/ TWL LRG LVL3 (GOWN DISPOSABLE) ×1 IMPLANT
GOWN STRL REUS W/ TWL XL LVL3 (GOWN DISPOSABLE) ×1 IMPLANT
GOWN STRL REUS W/TWL LRG LVL3 (GOWN DISPOSABLE) ×2
GOWN STRL REUS W/TWL XL LVL3 (GOWN DISPOSABLE) ×2
KIT TURNOVER KIT A (KITS) ×2 IMPLANT
LOOP RED MAXI  1X406MM (MISCELLANEOUS) ×1
LOOP VESSEL MAXI  1X406 RED (MISCELLANEOUS) ×1
LOOP VESSEL MAXI 1X406 RED (MISCELLANEOUS) ×1 IMPLANT
MANIFOLD NEPTUNE II (INSTRUMENTS) ×2 IMPLANT
NS IRRIG 500ML POUR BTL (IV SOLUTION) ×2 IMPLANT
PACK EXTREMITY ARMC (MISCELLANEOUS) ×2 IMPLANT
PAD ABD DERMACEA PRESS 5X9 (GAUZE/BANDAGES/DRESSINGS) ×2 IMPLANT
PAD CAST CTTN 4X4 STRL (SOFTGOODS) ×3 IMPLANT
PADDING CAST COTTON 4X4 STRL (SOFTGOODS) ×6
STAPLER SKIN PROX 35W (STAPLE) IMPLANT
STOCKINETTE IMPERVIOUS 9X36 MD (GAUZE/BANDAGES/DRESSINGS) ×2 IMPLANT
STRIP CLOSURE SKIN 1/4X4 (GAUZE/BANDAGES/DRESSINGS) ×1 IMPLANT
SUT VIC AB 0 CT1 36 (SUTURE) ×1 IMPLANT
SUT VIC AB 2-0 CT1 (SUTURE) ×2 IMPLANT
SUT VIC AB 2-0 CT1 36 (SUTURE) IMPLANT
SWABSTK COMLB BENZOIN TINCTURE (MISCELLANEOUS) ×2 IMPLANT

## 2021-02-25 NOTE — Transfer of Care (Signed)
Immediate Anesthesia Transfer of Care Note  Patient: Harold Harris  Procedure(s) Performed: SUBCUTANEOUS TRANSPOSITION OF THE ULNAR NERVE AT THE RIGHT ELBOW (Right: Elbow)  Patient Location: PACU  Anesthesia Type:General  Level of Consciousness: drowsy  Airway & Oxygen Therapy: Patient Spontanous Breathing and Patient connected to face mask oxygen  Post-op Assessment: Report given to RN  Post vital signs: stable  Last Vitals:  Vitals Value Taken Time  BP 119/59 02/25/21 0953  Temp    Pulse 56 02/25/21 0955  Resp 14 02/25/21 0955  SpO2 100 % 02/25/21 0955  Vitals shown include unvalidated device data.  Last Pain:  Vitals:   02/25/21 0751  TempSrc: Oral  PainSc: 5          Complications: No notable events documented.

## 2021-02-25 NOTE — Anesthesia Procedure Notes (Addendum)
Procedure Name: Intubation Date/Time: 02/25/2021 8:44 AM Performed by: Biagio Borg, CRNA Pre-anesthesia Checklist: Patient identified, Emergency Drugs available, Suction available and Patient being monitored Patient Re-evaluated:Patient Re-evaluated prior to induction Oxygen Delivery Method: Circle system utilized Preoxygenation: Pre-oxygenation with 100% oxygen Induction Type: IV induction Ventilation: Mask ventilation without difficulty LMA: LMA inserted LMA Size: 4.0 Tube type: Oral Number of attempts: 1 Placement Confirmation: ETT inserted through vocal cords under direct vision, positive ETCO2 and breath sounds checked- equal and bilateral Tube secured with: Tape Dental Injury: Teeth and Oropharynx as per pre-operative assessment

## 2021-02-25 NOTE — H&P (Signed)
History of Present Illness: Harold Harris is a 70 y.o. male who presents today for his history and physical for upcoming right ulnar nerve transposition. Surgery scheduled with Dr. Roland Rack on 02/25/2021. The patient denies any recent changes in his medical history. The patient is right-hand dominant. He continues to report chronic numbness and tingling in the fourth and fifth digits of the right hand. The patient does have a history of stroke and does take Plavix, he has been instructed on when to stop this medication prior to surgery. The patient does report a history of MI in the past but denies any personal history of asthma, COPD or history of blood clots. He denies any falls or trauma affecting the right upper extremity since he was last evaluated.  Past Medical History:  Hyperlipidemia   Hypertension   Serrated adenoma of colon, unspecified 09/18/2015   Thyroid disease   Tubular adenoma of colon, unspecified 09/18/2015   Past Surgical History:  APPENDECTOMY   CAROTID ENDARTERECTOMY 2008   CATARACT EXTRACTION Bilateral   COLONOSCOPY 09/18/2015 (Dr. Dorthy Cooler @ [facility] adenoma/Tubular adenoma/Repeat 31yr/MGR)   COLONOSCOPY 06/09/2018 (Tubular adenoma of the colon/Serrated adenoma/Repeat 3 months/MUS)   COLONOSCOPY 09/08/2018 (Serrated adenoma/Hyperplastic colon polyp/Repeat 63yrs/MUS)   TONSILLECTOMY   Past Family History:  Colon cancer Neg Hx   Colon polyps Neg Hx   Medications:  acetaminophen (TYLENOL) 650 MG ER tablet Take 650 mg by mouth every 8 (eight) hours as needed for Pain.   amLODIPine (NORVASC) 5 MG tablet Take 1 tablet (5 mg total) by mouth once daily 90 tablet 3   camphor-methyl salicyl-menthoL 4.0-98-11 % Gel Apply topically Apply 1 application topically daily as needed (pain).   cholecalciferol (VITAMIN D3) 1,000 unit capsule Take 1,000 Units by mouth as directed. Takes 5000 units qd   clopidogreL (PLAVIX) 75 mg tablet TAKE ONE TABLET BY MOUTH DAILY 90 tablet 1    cranberry conc-ascorbic acid 4,200-20 mg Cap Take 1 capsule by mouth once daily.   gabapentin (NEURONTIN) 300 MG capsule Take 1 capsule (300 mg total) by mouth nightly for 30 days 90 capsule 3   levothyroxine (SYNTHROID) 75 MCG tablet TAKE 1 TABLET ONCE DAILY WITH A GLASS OF WATER ON AN EMPTY STOMACH , AT LEAST 30 TO 60 MINUTES BEFORE BREAKFAST 90 tablet 3   losartan (COZAAR) 50 MG tablet TAKE 1 TABLET TWICE DAILY 180 tablet 3   melatonin 10 mg Tab Take by mouth Take 10 mg by mouth at bedtime.   rosuvastatin (CRESTOR) 20 MG tablet TAKE 1 TABLET EVERY DAY 90 tablet 3   tadalafiL (CIALIS) 20 MG tablet Take 1 tablet (20 mg total) by mouth every third day as needed for Erectile Dysfunction 30 tablet 5   zolpidem (AMBIEN) 10 mg tablet TAKE ONE TABLET BY MOUTH EVERY EVENING AS NEEDED FOR SLEEP 90 tablet 1   Allergies: No Known Allergies   Review of Systems:  A comprehensive 14 point ROS was performed, reviewed by me today, and the pertinent orthopaedic findings are documented in the HPI.  Physical Exam: BP 134/78  Ht 167 cm (5' 5.75")  Wt 85.2 kg (187 lb 12.8 oz)  BMI 30.54 kg/m  General/Constitutional: The patient appears to be well-nourished, well-developed, and in no acute distress. Neuro/Psych: Normal mood and affect, oriented to person, place and time. Eyes: Non-icteric. Pupils are equal, round, and reactive to light, and exhibit synchronous movement. ENT: Unremarkable. Lymphatic: No palpable adenopathy. Respiratory: Lungs clear to auscultation, Normal chest excursion and Non-labored  breathing Cardiovascular: Regular rate and rhythm. No murmurs. and No edema, swelling or tenderness, except as noted in detailed exam. Integumentary: No impressive skin lesions present, except as noted in detailed exam. Musculoskeletal: Unremarkable, except as noted in detailed exam.  Skin examination of the right upper extremity does reveal atrophy to the first dorsal webspace of the right hand. The  patient does have increased sensitivity over the ulnar aspect of the right hand. The patient does have moderate tenderness with palpation over the cubital tunnel. No tenderness palpation over the medial lateral epicondyle, over the triceps or biceps. There is no ecchymosis or erythema noted to the right elbow. The patient has full right elbow range of motion. There does not appear to be any ulnar nerve subluxation to the right upper extremity. Decreased sensation generalized to the right hand. Cap refills intact to each individual digit. Radial pulses intact to the right hand.  Imaging: The patient did undergo a nerve conduction study in May of this year. According to the impression the nerve conduction study demonstrated evidence of generalized sensory polyneuropathy with superimposed right ulnar neuropathy.  Impression: Cubital tunnel syndrome on right.  Plan:  1. Treatment options were discussed today with the patient. 2. The patient is scheduled for a subcutaneous ulnar nerve transposition at the right elbow. Surgery scheduled Dr. Roland Rack on 02/25/2021. 3. The patient was instructed on the risk and benefits and wishes to proceed at this time. 4. This document will serve as a surgical history and physical for the patient. 5. The patient will follow-up paracent postop protocol. They can call the clinic they have any questions, new symptoms develop or symptoms worsen.  The procedure was discussed with the patient, as were the potential risks (including bleeding, infection, nerve and/or blood vessel injury, persistent or recurrent pain, failure of the repair, continued numbness, need for further surgery, blood clots, strokes, heart attacks and/or arhythmias, pneumonia, etc.) and benefits. The patient states his understanding and wishes to proceed.   H&P reviewed and patient re-examined. No changes.

## 2021-02-25 NOTE — Op Note (Signed)
02/25/2021  10:05 AM  Patient:   Harold Harris  Pre-Op Diagnosis:   Right cubital tunnel syndrome.  Post-Op Diagnosis:   Same  Procedure:   Subcutaneous anterior transposition ulnar nerve, right elbow.  Surgeon:   Pascal Lux, MD  Assistant:   None  Anesthesia:   General LMA  Findings:   As above.  Complications:   None  EBL:   1 cc  Fluids:   500 cc crystalloid  TT:   45 minutes at 250 mmHg  Drains:   None  Closure:   2-0 Vicryl subcuticular sutures  Brief Clinical Note:   The patient is a 70 year old male with a history of progressively worsening pain and paresthesias to the right ring and little fingers, as well as right hand weakness. The symptoms have progressed despite medications, activity modification, etc. His history and examination were consistent with cubital tunnel syndrome, confirmed by an EMG. The patient presents at this time for subcutaneous anterior transposition of the ulnar nerve at the right elbow.  Procedure:   The patient was brought into the operating room and lain in the supine position. After adequate general laryngal mask anesthesia was obtained, the patient's right upper extremity was prepped with ChloraPrep solution before being draped sterilely. Preoperative antibiotics were administered. After performing a timeout to verify the appropriate surgical site, the limb was exsanguinated with an Esmarch and the tourniquet inflated to 250 mmHg. An approximately 7-8 cm curvilinear incision was made along the course of the ulnar nerve posterior to the medial epicondyle. The incision was carried down through the subcutaneous tissues with care taken to avoid the small branches of the medial antebrachial nerve to expose the sheath overlying the cubital tunnel.   The ulnar nerve was identified at the proximal end of the tunnel and was dissected free. The nerve was then carefully followed as the roof of the cubital tunnel was released from proximal to distal.  Distally, the fascia overlying the pronator muscle was released for several centimeters. The nerve was clearly thickened just proximal to the fascia overlying and between the pronator heads, indicating the most likely source of the nerve compression. A vessel loop was passed around the nerve and used to provide gentle traction on the nerve while circumferential dissection was carried out under loupe magnification using bipolar electrocautery and tenotomy scissors.   Once the nerve was fully mobilized, the anterior tissues were elevated as a flap just superficial to the fascia overlying the common flexor origin and a pocket created to accept the nerve. Care was taken to be sure that there was no undue tension along the nerve either proximally or distally. The nerve was carefully retracted while several 2-0 Vicryl interrupted sutures were placed to reapproximate the flap to the medial epicondylar soft tissues, thereby creating a "sling" for the ulnar nerve. The cubital tunnel itself was reapproximated using several 2-0 Vicryl interrupted sutures in order to prevent the nerve from falling back into the cubital tunnel.   The wound was copiously irrigated with sterile saline solution before the subcutaneous tissues were closed using 2-0 Vicryl interrupted sutures. The skin was closed using benzoin and Steri-Strips. A total of 20 cc of 0.5% plain Sensorcaine was injected in and around the incision to help with postoperative analgesia. A sterile bulky dressing was applied to the arm before the patient was placed into a sling. The patient was then awakened, extubated, and returned to the recovery room in satisfactory condition after tolerating the procedure well.

## 2021-02-25 NOTE — Discharge Instructions (Addendum)
Orthopedic discharge instructions: Keep dressing dry and intact. Keep hand elevated above heart level. May shower after dressing removed on postop day 4 (Sunday). Cover staples/sutures with Band-Aids after drying off. Apply ice to affected area frequently. Take ibuprofen 600-800 mg TID with meals for 7-10 days, then as necessary. Take ES Tylenol or pain medication as prescribed when needed.  Return for follow-up in 10-14 days or as scheduled.   AMBULATORY SURGERY  DISCHARGE INSTRUCTIONS   The drugs that you were given will stay in your system until tomorrow so for the next 24 hours you should not:  Drive an automobile Make any legal decisions Drink any alcoholic beverage   You may resume regular meals tomorrow.  Today it is better to start with liquids and gradually work up to solid foods.  You may eat anything you prefer, but it is better to start with liquids, then soup and crackers, and gradually work up to solid foods.   Please notify your doctor immediately if you have any unusual bleeding, trouble breathing, redness and pain at the surgery site, drainage, fever, or pain not relieved by medication.    Additional Instructions:        Please contact your physician with any problems or Same Day Surgery at [phone], Monday through Friday 6 am to 4 pm, or [place] at [facility] number at [phone].

## 2021-02-25 NOTE — Anesthesia Preprocedure Evaluation (Signed)
Anesthesia Evaluation  Patient identified by MRN, date of birth, ID band Patient awake    Reviewed: Allergy & Precautions, H&P , NPO status , Patient's Chart, lab work & pertinent test results, reviewed documented beta blocker date and time   History of Anesthesia Complications Negative for: history of anesthetic complications  Airway Mallampati: I  TM Distance: >3 FB Neck ROM: full    Dental  (+) Upper Dentures, Lower Dentures, Dental Advidsory Given   Pulmonary neg pulmonary ROS, Current Smoker, former smoker,           Cardiovascular Exercise Tolerance: Good hypertension, (-) angina+ Peripheral Vascular Disease  (-) CAD, (-) Past MI, (-) Cardiac Stents and (-) CABG (-) dysrhythmias (-) Valvular Problems/Murmurs     Neuro/Psych neg Seizures CVA, Residual Symptoms negative psych ROS   GI/Hepatic Neg liver ROS, GERD  ,  Endo/Other  Hypothyroidism   Renal/GU negative Renal ROS  negative genitourinary   Musculoskeletal   Abdominal   Peds  Hematology negative hematology ROS (+)   Anesthesia Other Findings Past Medical History: No date: Hyperlipidemia No date: Hypertension No date: Serrated adenoma of colon No date: Thyroid disease No date: Thyroid disease No date: Tubular adenoma of colon   Reproductive/Obstetrics negative OB ROS                             Anesthesia Physical  Anesthesia Plan  ASA: 2  Anesthesia Plan: General   Post-op Pain Management:    Induction: Intravenous  PONV Risk Score and Plan: 2 and Ondansetron, Dexamethasone, Midazolam and Treatment may vary due to age or medical condition  Airway Management Planned: LMA  Additional Equipment:   Intra-op Plan:   Post-operative Plan: Extubation in OR  Informed Consent: I have reviewed the patients History and Physical, chart, labs and discussed the procedure including the risks, benefits and alternatives for  the proposed anesthesia with the patient or authorized representative who has indicated his/her understanding and acceptance.     Dental Advisory Given  Plan Discussed with: Anesthesiologist, CRNA and Surgeon  Anesthesia Plan Comments:         Anesthesia Quick Evaluation

## 2021-02-26 NOTE — Anesthesia Postprocedure Evaluation (Signed)
Anesthesia Post Note  Patient: Harold Harris  Procedure(s) Performed: SUBCUTANEOUS TRANSPOSITION OF THE ULNAR NERVE AT THE RIGHT ELBOW (Right: Elbow)  Patient location during evaluation: PACU Anesthesia Type: General Level of consciousness: awake and alert Pain management: pain level controlled Vital Signs Assessment: post-procedure vital signs reviewed and stable Respiratory status: spontaneous breathing, nonlabored ventilation, respiratory function stable and patient connected to nasal cannula oxygen Cardiovascular status: blood pressure returned to baseline and stable Postop Assessment: no apparent nausea or vomiting Anesthetic complications: no   No notable events documented.   Last Vitals:  Vitals:   02/25/21 1135 02/25/21 1138  BP: (!) 122/48 (!) 114/59  Pulse: (!) 52   Resp: 18   Temp:    SpO2: 98%     Last Pain:  Vitals:   02/25/21 1135  TempSrc:   PainSc: 3                  Martha Clan

## 2021-03-04 DIAGNOSIS — M9904 Segmental and somatic dysfunction of sacral region: Secondary | ICD-10-CM | POA: Diagnosis not present

## 2021-03-04 DIAGNOSIS — M542 Cervicalgia: Secondary | ICD-10-CM | POA: Diagnosis not present

## 2021-03-04 DIAGNOSIS — M9901 Segmental and somatic dysfunction of cervical region: Secondary | ICD-10-CM | POA: Diagnosis not present

## 2021-03-04 DIAGNOSIS — R202 Paresthesia of skin: Secondary | ICD-10-CM | POA: Diagnosis not present

## 2021-03-04 DIAGNOSIS — M50322 Other cervical disc degeneration at C5-C6 level: Secondary | ICD-10-CM | POA: Diagnosis not present

## 2021-03-04 DIAGNOSIS — M5451 Vertebrogenic low back pain: Secondary | ICD-10-CM | POA: Diagnosis not present

## 2021-03-04 DIAGNOSIS — M5441 Lumbago with sciatica, right side: Secondary | ICD-10-CM | POA: Diagnosis not present

## 2021-03-04 DIAGNOSIS — M5413 Radiculopathy, cervicothoracic region: Secondary | ICD-10-CM | POA: Diagnosis not present

## 2021-03-04 DIAGNOSIS — M9903 Segmental and somatic dysfunction of lumbar region: Secondary | ICD-10-CM | POA: Diagnosis not present

## 2021-03-05 DIAGNOSIS — M50322 Other cervical disc degeneration at C5-C6 level: Secondary | ICD-10-CM | POA: Diagnosis not present

## 2021-03-05 DIAGNOSIS — R202 Paresthesia of skin: Secondary | ICD-10-CM | POA: Diagnosis not present

## 2021-03-05 DIAGNOSIS — M7918 Myalgia, other site: Secondary | ICD-10-CM | POA: Diagnosis not present

## 2021-03-05 DIAGNOSIS — M9901 Segmental and somatic dysfunction of cervical region: Secondary | ICD-10-CM | POA: Diagnosis not present

## 2021-03-05 DIAGNOSIS — M50323 Other cervical disc degeneration at C6-C7 level: Secondary | ICD-10-CM | POA: Diagnosis not present

## 2021-03-05 DIAGNOSIS — M9903 Segmental and somatic dysfunction of lumbar region: Secondary | ICD-10-CM | POA: Diagnosis not present

## 2021-03-05 DIAGNOSIS — M9904 Segmental and somatic dysfunction of sacral region: Secondary | ICD-10-CM | POA: Diagnosis not present

## 2021-03-05 DIAGNOSIS — M5441 Lumbago with sciatica, right side: Secondary | ICD-10-CM | POA: Diagnosis not present

## 2021-03-10 DIAGNOSIS — I10 Essential (primary) hypertension: Secondary | ICD-10-CM | POA: Diagnosis not present

## 2021-03-10 DIAGNOSIS — R202 Paresthesia of skin: Secondary | ICD-10-CM | POA: Diagnosis not present

## 2021-03-10 DIAGNOSIS — M50322 Other cervical disc degeneration at C5-C6 level: Secondary | ICD-10-CM | POA: Diagnosis not present

## 2021-03-10 DIAGNOSIS — E782 Mixed hyperlipidemia: Secondary | ICD-10-CM | POA: Diagnosis not present

## 2021-03-10 DIAGNOSIS — M5451 Vertebrogenic low back pain: Secondary | ICD-10-CM | POA: Diagnosis not present

## 2021-03-10 DIAGNOSIS — M9904 Segmental and somatic dysfunction of sacral region: Secondary | ICD-10-CM | POA: Diagnosis not present

## 2021-03-10 DIAGNOSIS — M5413 Radiculopathy, cervicothoracic region: Secondary | ICD-10-CM | POA: Diagnosis not present

## 2021-03-10 DIAGNOSIS — M5441 Lumbago with sciatica, right side: Secondary | ICD-10-CM | POA: Diagnosis not present

## 2021-03-10 DIAGNOSIS — M545 Low back pain, unspecified: Secondary | ICD-10-CM | POA: Diagnosis not present

## 2021-03-10 DIAGNOSIS — M9901 Segmental and somatic dysfunction of cervical region: Secondary | ICD-10-CM | POA: Diagnosis not present

## 2021-03-10 DIAGNOSIS — M79604 Pain in right leg: Secondary | ICD-10-CM | POA: Diagnosis not present

## 2021-03-10 DIAGNOSIS — M9903 Segmental and somatic dysfunction of lumbar region: Secondary | ICD-10-CM | POA: Diagnosis not present

## 2021-03-11 DIAGNOSIS — R200 Anesthesia of skin: Secondary | ICD-10-CM | POA: Diagnosis not present

## 2021-03-11 DIAGNOSIS — G5621 Lesion of ulnar nerve, right upper limb: Secondary | ICD-10-CM | POA: Diagnosis not present

## 2021-03-11 DIAGNOSIS — R202 Paresthesia of skin: Secondary | ICD-10-CM | POA: Diagnosis not present

## 2021-03-13 DIAGNOSIS — M5136 Other intervertebral disc degeneration, lumbar region: Secondary | ICD-10-CM | POA: Diagnosis not present

## 2021-03-16 ENCOUNTER — Other Ambulatory Visit: Payer: Self-pay | Admitting: Internal Medicine

## 2021-03-16 DIAGNOSIS — M5030 Other cervical disc degeneration, unspecified cervical region: Secondary | ICD-10-CM

## 2021-03-18 ENCOUNTER — Other Ambulatory Visit: Payer: Self-pay

## 2021-03-18 ENCOUNTER — Ambulatory Visit
Admission: RE | Admit: 2021-03-18 | Discharge: 2021-03-18 | Disposition: A | Discharge status: Home / Self Care | Payer: Medicare HMO | Source: Ambulatory Visit | Attending: Internal Medicine | Admitting: Internal Medicine

## 2021-03-18 DIAGNOSIS — M545 Low back pain, unspecified: Secondary | ICD-10-CM | POA: Diagnosis not present

## 2021-03-18 DIAGNOSIS — M5030 Other cervical disc degeneration, unspecified cervical region: Secondary | ICD-10-CM | POA: Diagnosis not present

## 2021-04-01 DIAGNOSIS — M5441 Lumbago with sciatica, right side: Secondary | ICD-10-CM | POA: Diagnosis not present

## 2021-04-01 DIAGNOSIS — M48062 Spinal stenosis, lumbar region with neurogenic claudication: Secondary | ICD-10-CM | POA: Diagnosis not present

## 2021-04-02 ENCOUNTER — Inpatient Hospital Stay
Admission: EM | Admit: 2021-04-02 | Discharge: 2021-04-06 | DRG: 064 | Disposition: E | Payer: Medicare HMO | Attending: Internal Medicine | Admitting: Internal Medicine

## 2021-04-02 ENCOUNTER — Emergency Department: Payer: Medicare HMO

## 2021-04-02 ENCOUNTER — Other Ambulatory Visit: Payer: Self-pay

## 2021-04-02 DIAGNOSIS — Z8673 Personal history of transient ischemic attack (TIA), and cerebral infarction without residual deficits: Secondary | ICD-10-CM

## 2021-04-02 DIAGNOSIS — E039 Hypothyroidism, unspecified: Secondary | ICD-10-CM | POA: Diagnosis not present

## 2021-04-02 DIAGNOSIS — I619 Nontraumatic intracerebral hemorrhage, unspecified: Secondary | ICD-10-CM | POA: Diagnosis not present

## 2021-04-02 DIAGNOSIS — R404 Transient alteration of awareness: Secondary | ICD-10-CM | POA: Diagnosis not present

## 2021-04-02 DIAGNOSIS — R4020 Unspecified coma: Secondary | ICD-10-CM | POA: Diagnosis not present

## 2021-04-02 DIAGNOSIS — F17210 Nicotine dependence, cigarettes, uncomplicated: Secondary | ICD-10-CM | POA: Diagnosis present

## 2021-04-02 DIAGNOSIS — G935 Compression of brain: Secondary | ICD-10-CM | POA: Diagnosis present

## 2021-04-02 DIAGNOSIS — Z66 Do not resuscitate: Secondary | ICD-10-CM | POA: Diagnosis present

## 2021-04-02 DIAGNOSIS — Z20822 Contact with and (suspected) exposure to covid-19: Secondary | ICD-10-CM | POA: Diagnosis not present

## 2021-04-02 DIAGNOSIS — K219 Gastro-esophageal reflux disease without esophagitis: Secondary | ICD-10-CM | POA: Diagnosis present

## 2021-04-02 DIAGNOSIS — J9690 Respiratory failure, unspecified, unspecified whether with hypoxia or hypercapnia: Secondary | ICD-10-CM | POA: Diagnosis not present

## 2021-04-02 DIAGNOSIS — Z8371 Family history of colonic polyps: Secondary | ICD-10-CM

## 2021-04-02 DIAGNOSIS — Z79890 Hormone replacement therapy (postmenopausal): Secondary | ICD-10-CM

## 2021-04-02 DIAGNOSIS — M1990 Unspecified osteoarthritis, unspecified site: Secondary | ICD-10-CM | POA: Diagnosis present

## 2021-04-02 DIAGNOSIS — G919 Hydrocephalus, unspecified: Secondary | ICD-10-CM | POA: Diagnosis not present

## 2021-04-02 DIAGNOSIS — Z809 Family history of malignant neoplasm, unspecified: Secondary | ICD-10-CM | POA: Diagnosis not present

## 2021-04-02 DIAGNOSIS — R4182 Altered mental status, unspecified: Secondary | ICD-10-CM | POA: Diagnosis not present

## 2021-04-02 DIAGNOSIS — I615 Nontraumatic intracerebral hemorrhage, intraventricular: Secondary | ICD-10-CM | POA: Diagnosis not present

## 2021-04-02 DIAGNOSIS — Z515 Encounter for palliative care: Secondary | ICD-10-CM

## 2021-04-02 DIAGNOSIS — I10 Essential (primary) hypertension: Secondary | ICD-10-CM | POA: Diagnosis not present

## 2021-04-02 DIAGNOSIS — Z7902 Long term (current) use of antithrombotics/antiplatelets: Secondary | ICD-10-CM

## 2021-04-02 DIAGNOSIS — E162 Hypoglycemia, unspecified: Secondary | ICD-10-CM | POA: Diagnosis not present

## 2021-04-02 DIAGNOSIS — Z79899 Other long term (current) drug therapy: Secondary | ICD-10-CM | POA: Diagnosis not present

## 2021-04-02 DIAGNOSIS — R0689 Other abnormalities of breathing: Secondary | ICD-10-CM | POA: Diagnosis not present

## 2021-04-02 DIAGNOSIS — I609 Nontraumatic subarachnoid hemorrhage, unspecified: Secondary | ICD-10-CM | POA: Diagnosis not present

## 2021-04-02 DIAGNOSIS — E785 Hyperlipidemia, unspecified: Secondary | ICD-10-CM | POA: Diagnosis present

## 2021-04-02 DIAGNOSIS — Z8249 Family history of ischemic heart disease and other diseases of the circulatory system: Secondary | ICD-10-CM | POA: Diagnosis not present

## 2021-04-02 DIAGNOSIS — E161 Other hypoglycemia: Secondary | ICD-10-CM | POA: Diagnosis not present

## 2021-04-02 DIAGNOSIS — I739 Peripheral vascular disease, unspecified: Secondary | ICD-10-CM | POA: Diagnosis not present

## 2021-04-02 DIAGNOSIS — R55 Syncope and collapse: Secondary | ICD-10-CM | POA: Diagnosis not present

## 2021-04-02 LAB — COMPREHENSIVE METABOLIC PANEL
ALT: 28 U/L (ref 0–44)
AST: 30 U/L (ref 15–41)
Albumin: 4.3 g/dL (ref 3.5–5.0)
Alkaline Phosphatase: 56 U/L (ref 38–126)
Anion gap: 10 (ref 5–15)
BUN: 22 mg/dL (ref 8–23)
CO2: 25 mmol/L (ref 22–32)
Calcium: 9.8 mg/dL (ref 8.9–10.3)
Chloride: 106 mmol/L (ref 98–111)
Creatinine, Ser: 1.06 mg/dL (ref 0.61–1.24)
GFR, Estimated: >60 mL/min (ref >60)
Glucose, Bld: 134 mg/dL — ABNORMAL HIGH (ref 70–99)
Potassium: 4.3 mmol/L (ref 3.5–5.1)
Sodium: 141 mmol/L (ref 135–145)
Total Bilirubin: 1.3 mg/dL — ABNORMAL HIGH (ref 0.3–1.2)
Total Protein: 7.5 g/dL (ref 6.5–8.1)

## 2021-04-02 LAB — CBC WITH DIFFERENTIAL/PLATELET
Abs Immature Granulocytes: 0.02 10*3/uL (ref 0.00–0.07)
Basophils Absolute: 0 10*3/uL (ref 0.0–0.1)
Basophils Relative: 0 %
Eosinophils Absolute: 0 10*3/uL (ref 0.0–0.5)
Eosinophils Relative: 0 %
HCT: 45.9 % (ref 39.0–52.0)
Hemoglobin: 16.1 g/dL (ref 13.0–17.0)
Immature Granulocytes: 0 %
Lymphocytes Relative: 6 %
Lymphs Abs: 0.5 10*3/uL — ABNORMAL LOW (ref 0.7–4.0)
MCH: 31.5 pg (ref 26.0–34.0)
MCHC: 35.1 g/dL (ref 30.0–36.0)
MCV: 89.8 fL (ref 80.0–100.0)
Monocytes Absolute: 0.3 10*3/uL (ref 0.1–1.0)
Monocytes Relative: 4 %
Neutro Abs: 7.4 10*3/uL (ref 1.7–7.7)
Neutrophils Relative %: 90 %
Platelets: 144 10*3/uL — ABNORMAL LOW (ref 150–400)
RBC: 5.11 MIL/uL (ref 4.22–5.81)
RDW: 12.5 % (ref 11.5–15.5)
WBC: 8.2 10*3/uL (ref 4.0–10.5)
nRBC: 0 % (ref 0.0–0.2)

## 2021-04-02 LAB — URINALYSIS, COMPLETE (UACMP) WITH MICROSCOPIC
Bacteria, UA: NONE SEEN
Bilirubin Urine: NEGATIVE
Glucose, UA: NEGATIVE mg/dL
Ketones, ur: NEGATIVE mg/dL
Leukocytes,Ua: NEGATIVE
Nitrite: NEGATIVE
Protein, ur: NEGATIVE mg/dL
Specific Gravity, Urine: 1.008 (ref 1.005–1.030)
Squamous Epithelial / HPF: NONE SEEN (ref 0–5)
WBC, UA: NONE SEEN WBC/hpf (ref 0–5)
pH: 6 (ref 5.0–8.0)

## 2021-04-02 LAB — BLOOD GAS, VENOUS
Acid-Base Excess: 2.4 mmol/L — ABNORMAL HIGH (ref 0.0–2.0)
Bicarbonate: 26.4 mmol/L (ref 20.0–28.0)
O2 Saturation: 94.6 %
Patient temperature: 37
pCO2, Ven: 38 mmHg — ABNORMAL LOW (ref 44.0–60.0)
pH, Ven: 7.45 — ABNORMAL HIGH (ref 7.250–7.430)
pO2, Ven: 70 mmHg — ABNORMAL HIGH (ref 32.0–45.0)

## 2021-04-02 LAB — URINE DRUG SCREEN, QUALITATIVE (ARMC ONLY)
Amphetamines, Ur Screen: NOT DETECTED
Barbiturates, Ur Screen: NOT DETECTED
Benzodiazepine, Ur Scrn: NOT DETECTED
Cannabinoid 50 Ng, Ur ~~LOC~~: NOT DETECTED
Cocaine Metabolite,Ur ~~LOC~~: NOT DETECTED
MDMA (Ecstasy)Ur Screen: NOT DETECTED
Methadone Scn, Ur: NOT DETECTED
Opiate, Ur Screen: NOT DETECTED
Phencyclidine (PCP) Ur S: NOT DETECTED
Tricyclic, Ur Screen: NOT DETECTED

## 2021-04-02 LAB — TROPONIN I (HIGH SENSITIVITY): Troponin I (High Sensitivity): 10 ng/L (ref <18)

## 2021-04-02 LAB — LACTIC ACID, PLASMA: Lactic Acid, Venous: 2.4 mmol/L (ref 0.5–1.9)

## 2021-04-02 LAB — PROTIME-INR
INR: 1 (ref 0.8–1.2)
Prothrombin Time: 13 seconds (ref 11.4–15.2)

## 2021-04-02 LAB — ETHANOL: Alcohol, Ethyl (B): <10 mg/dL (ref <10)

## 2021-04-02 MED ORDER — STROKE: EARLY STAGES OF RECOVERY BOOK: Freq: Once | Status: DC

## 2021-04-02 MED ORDER — ACETAMINOPHEN 325 MG PO TABS: 650.0000 mg | ORAL_TABLET | ORAL | Status: DC | PRN

## 2021-04-02 MED ORDER — ACETAMINOPHEN 160 MG/5ML PO SOLN
650.0000 mg | ORAL | Status: DC | PRN
  Filled 2021-04-02: qty 20.3

## 2021-04-02 MED ORDER — PANTOPRAZOLE SODIUM 40 MG IV SOLR: 40.0000 mg | Freq: Every day | INTRAVENOUS | Status: DC

## 2021-04-02 MED ORDER — SODIUM CHLORIDE 3 % IV BOLUS
250.0000 mL | Freq: Once | INTRAVENOUS | Status: AC
  Administered 2021-04-02: 250 mL via INTRAVENOUS
  Filled 2021-04-02: qty 500

## 2021-04-02 MED ORDER — NICARDIPINE HCL IN NACL 20-0.86 MG/200ML-% IV SOLN
3.0000 mg/h | INTRAVENOUS | Status: DC
  Administered 2021-04-02: 5 mg/h via INTRAVENOUS
  Administered 2021-04-02: 15 mg/h via INTRAVENOUS
  Filled 2021-04-02 (×2): qty 200

## 2021-04-02 MED ORDER — ACETAMINOPHEN 650 MG RE SUPP: 650.0000 mg | RECTAL | Status: DC | PRN

## 2021-04-02 MED ORDER — MIDAZOLAM HCL 2 MG/2ML IJ SOLN
1.0000 mg | INTRAMUSCULAR | Status: DC | PRN
Start: 2021-04-02 — End: 2021-04-03
  Filled 2021-04-02: qty 2

## 2021-04-02 MED ORDER — FENTANYL CITRATE (PF) 100 MCG/2ML IJ SOLN: 50.0000 ug | INTRAMUSCULAR | Status: DC | PRN

## 2021-04-02 MED ORDER — PROPOFOL 1000 MG/100ML IV EMUL
0.0000 ug/kg/min | INTRAVENOUS | Status: DC
  Administered 2021-04-02: 5 ug/kg/min via INTRAVENOUS

## 2021-04-02 MED ORDER — MIDAZOLAM HCL 2 MG/2ML IJ SOLN
1.0000 mg | INTRAMUSCULAR | Status: DC | PRN
  Administered 2021-04-02: 1 mg via INTRAVENOUS

## 2021-04-02 MED ORDER — SENNOSIDES-DOCUSATE SODIUM 8.6-50 MG PO TABS: 1.0000 | ORAL_TABLET | Freq: Two times a day (BID) | ORAL | Status: DC

## 2021-04-02 NOTE — ED Notes (Signed)
ED Provider at bedside. 

## 2021-04-02 NOTE — ED Notes (Signed)
ED Provider at bedside speaking with family member at this time.
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
## 2021-04-02 NOTE — ED Notes (Signed)
Admitting at bedside at this time with family.

## 2021-04-02 NOTE — Progress Notes (Addendum)
  GOALS OF CARE   Advance Care Planning/Goals of Care discussion was performed during the course of treatment to decide on type of care right for this patient following admission to the ICU.  I met with patient's wife and his two sons to discuss goals of care in details following review of patient's lab and diagnostic data, ED Physician's report and the consulting Neurosurgery's notes. I reviewed CT head with the family at the bedside and answered all their question.  FOCUSED NEURO EXAM Patient is currently intubated, mechanically ventilated and sedated. Sedation was held briefly for focused Neuro Exam. Patient does not respond to verbal stimuli.  Does not respond to deep sternal rub.  Does not follow commands.  No verbalizations noted.  Cranial Nerves: II: patient does not respond confrontation bilaterally, pupils right 3 mm, left 4 mm,and non-reactive bilaterally III,IV,VI: doll's response absent bilaterally.  V,VII: corneal reflex absent bilaterally  VIII: patient does not respond to verbal stimuli IX,X: gag reflex absent, XI: trapezius strength unable to test bilaterally XII: tongue strength unable to test Motor: Extremities flaccid throughout.  No spontaneous movement noted.  No purposeful movements noted. Sensory: Does not respond to noxious stimuli in any extremity. Deep Tendon Reflexes:  Absent throughout. Plantars: absent bilaterally Cerebellar: Unable to perform  Discussed prognosis, expected outcome with or without ongoing aggressive treatments and the options for de-escalation of care.   Diagnosis(es): massive IVH with 3.6x2.9x2 cm L thalamic IPH with extension into the L thalamus Prognosis: Poor Code Status: DNR Disposition: ICU Next Steps: I discussed this admission with on call intensivist Dr. Mortimer Fries who advised to admit to the ICU . Family understands the situation. They have consented and agreed to DNR/DNI and would not wish to pursue any aggressive treatment. Per  family, they would wish to allow the rest of the extended family including a grandson who is coming from [place] to arrive prior to transitioning to comfort care and terminal extubation.  Family are satisfied with Plan of action and management. All questions answered   Total Time Spent Face to Face addressing advance care planning in the presence of the Patient: 45_  minutes    Rufina Falco, DNP, FNP-C, AGACNP-BC Acute Care Nurse Practitioner  [place] [facility] Pager: [phone] [place] at [facility]  .eog

## 2021-04-02 NOTE — ED Notes (Addendum)
Chaplain at bedside with wife at this time.

## 2021-04-02 NOTE — ED Notes (Signed)
Admitting notified of pt HR and BP at this time.
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
## 2021-04-02 NOTE — ED Notes (Signed)
ED Provider at bedside with family and chaplain at this time.

## 2021-04-02 NOTE — Consult Note (Signed)
Dr. Charna Archer and I discussed this patient's care.  70 yo male presented comatose with agonal breathing without any significant neurological exam.  CT showing massive IVH with 3.6x2.9x2 cm L thalamic IPH replacing his L thalamus. He does have hydrocephalus, and also shows some decreased attenuation in the pons suggesting that his brain stem has already been affected.  Based on CT findings alone, he harbors a dismal prognosis.  I have recommended discussion of goals of care. Given the involvement of his dominant hemisphere, he will not harbor any reasonable hope of a functionally independent survival. I do not think that intervention is ethical given his dismal prognosis.  If his family elects for full intervention, he may need transfer and guidance from neurology.

## 2021-04-02 NOTE — ED Notes (Signed)
ED Provider at bedside speaking with family at this time.
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
## 2021-04-02 NOTE — Progress Notes (Signed)
Chaplain Maggie made initial visit with patient and his wife, Harold Harris. Space was made for offering a non-anxious presence and hospitality toward family as they gathered at the beside. Chaplain remained attentive to the family as they processed the patient's condition and considered decisions for moving forward with care. Prayer was shared at the beside with patient's wife and sister. Chaplain will follow up and is available for continued support per on call chaplain at [phone].

## 2021-04-02 NOTE — ED Notes (Signed)
Admitting at bedside 

## 2021-04-02 NOTE — Consult Note (Signed)
MEDICATION RELATED CONSULT NOTE - INITIAL   Pharmacy Consult for Hypertonic Saline Na monitoring Indication: ICH w/ herniation  No Known Allergies  Patient Measurements: Height: '5\' 8"'$  (172.7 cm) Weight: 79.4 kg (175 lb) IBW/kg (Calculated) : 68.4   Labs: Recent Labs    03/24/2021 1822  WBC 8.2  HGB 16.1  HCT 45.9  PLT 144*  CREATININE 1.06  ALBUMIN 4.3  PROT 7.5  AST 30  ALT 28  ALKPHOS 56  BILITOT 1.3*   Estimated Creatinine Clearance: 61.8 mL/min (by C-G formula based on SCr of 1.06 mg/dL).   Microbiology: No results found for this or any previous visit (from the past 720 hour(s)).  Medical History: Past Medical History:  Diagnosis Date   Arthritis    Carotid artery stenosis with cerebral infarction (HCC)    Complication of anesthesia    Family history of colonic polyps    GERD (gastroesophageal reflux disease)    History of kidney stones    Hyperlipidemia    Hypertension    Hypothyroidism    Peripheral vascular disease (HCC)    Personal history of colonic polyps    Serrated adenoma of colon    Stroke ([place])    Thyroid disease    Thyroid disease    Tubular adenoma of colon      Assessment:  70 yo male presented comatose with agonal breathing without any significant neurological exam. Found to have massive IVH w/ herniation.    7/28 1950 Na 3% 250 mL bolus given  7/28 1822 Na =141    Goal of Therapy:  Na 150-155 mEq/L  Plan:  Continue Q6H Na checks Follow up if additional hypertonic saline or therapy continued   Dorothe Pea, PharmD, BCPS Clinical Pharmacist   03/09/2021,8:23 PM

## 2021-04-03 LAB — RESP PANEL BY RT-PCR (FLU A&B, COVID) ARPGX2
Influenza A by PCR: NEGATIVE
Influenza B by PCR: NEGATIVE
SARS Coronavirus 2 by RT PCR: NEGATIVE

## 2021-04-03 MED ORDER — ONDANSETRON 4 MG PO TBDP: 4.0000 mg | ORAL_TABLET | Freq: Four times a day (QID) | ORAL | Status: DC | PRN

## 2021-04-03 MED ORDER — GLYCOPYRROLATE 1 MG PO TABS
1.0000 mg | ORAL_TABLET | ORAL | Status: DC | PRN
  Filled 2021-04-03: qty 1

## 2021-04-03 MED ORDER — LORAZEPAM 2 MG/ML IJ SOLN: 1.0000 mg | INTRAMUSCULAR | Status: DC | PRN

## 2021-04-03 MED ORDER — LORAZEPAM 2 MG/ML PO CONC
1.0000 mg | ORAL | Status: DC | PRN
  Filled 2021-04-03: qty 0.5

## 2021-04-03 MED ORDER — LORAZEPAM 1 MG PO TABS: 1.0000 mg | ORAL_TABLET | ORAL | Status: DC | PRN

## 2021-04-03 MED ORDER — GLYCOPYRROLATE 0.2 MG/ML IJ SOLN
0.2000 mg | INTRAMUSCULAR | Status: DC | PRN
  Filled 2021-04-03: qty 1

## 2021-04-03 MED ORDER — MORPHINE SULFATE (PF) 2 MG/ML IV SOLN: 1.0000 mg | INTRAVENOUS | Status: DC | PRN

## 2021-04-03 MED ORDER — MORPHINE SULFATE (PF) 2 MG/ML IV SOLN
2.0000 mg | Freq: Once | INTRAVENOUS | Status: AC
  Administered 2021-04-03: 2 mg via INTRAVENOUS

## 2021-04-03 MED ORDER — ONDANSETRON HCL 4 MG/2ML IJ SOLN: 4.0000 mg | Freq: Four times a day (QID) | INTRAMUSCULAR | Status: DC | PRN

## 2021-04-03 MED ORDER — POLYVINYL ALCOHOL 1.4 % OP SOLN
1.0000 [drp] | Freq: Four times a day (QID) | OPHTHALMIC | Status: DC | PRN
  Filled 2021-04-03: qty 15

## 2021-04-03 MED ORDER — MORPHINE SULFATE (PF) 2 MG/ML IV SOLN
INTRAVENOUS | Status: AC
  Filled 2021-04-03: qty 1

## 2021-04-06 NOTE — Progress Notes (Signed)
Chaplain Maggie provided consistent spiritual and emotional support to the family during end of life. Empathetic listening and hospitality helped make room for the patient's wife to make difficult decisions and to say goodbye. Chaplain offered a non-anxious presence in the midst of loss.

## 2021-04-06 NOTE — ED Notes (Signed)
Admitting and RT at bedside at this time.

## 2021-04-06 NOTE — Progress Notes (Addendum)
Notified by patient's primary RN that patient was showing signs of worsening intracranial pressure.  On arrival to the bedside, patient noted with signs of Cushing's  triad (bradycardia in the 30s, and widened pulse pressure) blood pressure  63/31 mm Hg and pulse rate 37 beats/min. RR 10. No change in neuro exam from previous finding.  I again discussed with patient's family at the bedside of worsening clinical status and probable impending death.  We again discussed transition to comfort care given the inevitable. Family would not wish to pursue any aggressive measures or treatment at this time. They however would want to wait for patient's grandson who is currently driving from [place] prior to terminal extubation.  Per patient's wife, patient's grandson is his favorite and would wish to at least say goodbye to him.  Chaplain at the bedside.  Rufina Falco, DNP, CCRN, FNP-C, AGACNP-BC Acute Care Nurse Practitioner  [facility] Pager: [phone] [place] [facility]

## 2021-04-06 NOTE — ED Notes (Signed)
Chaplain at bedside at this time.

## 2021-04-06 NOTE — Progress Notes (Signed)
Pt terminally extubated per NP order. RN, NP and chaplain at bedside.
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
## 2021-04-06 NOTE — Progress Notes (Signed)
Notified by patient's RN that patient's grandson had arrived from [place]. Per RN, the patient's wife now indicated that she would not wish to terminally extubate him at this time. Patient remained hypotensive with blood pressure 53/31 mm Hg and bradycardic between 40's and low 50's. He was also noted with copious bloody oral secretions. Exam remains poor and unchanged from previous assessment. Family requested a private moment with the patient at this time. Will continue to monitor and provide emotional support as needed.  Rufina Falco, DNP, CCRN, FNP-C, AGACNP-BC Acute Care Nurse Practitioner  [place] [facility] Pager: [phone] [place] at [facility]

## 2021-04-06 NOTE — ED Notes (Signed)
Admitting and chaplain at bedside.

## 2021-04-06 DEATH — deceased

## 2022-09-30 IMAGING — MR MR CERVICAL SPINE W/O CM
5 series · 39 of 48 positions shown · non-contrast
Comparison: None.

CLINICAL DATA: Right hand numbness and weakness.

EXAM:
MRI CERVICAL SPINE WITHOUT CONTRAST
TECHNIQUE: Multiplanar, multisequence MR imaging of the cervical spine was
performed. No intravenous contrast was administered.

[Series 5: T2 · sagittal · 3.0mm · 0.62mm/px · 6 of 15 slices shown (1 of 2)]
[im 1/15]
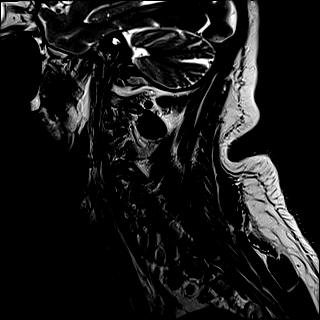
[im 3/15]
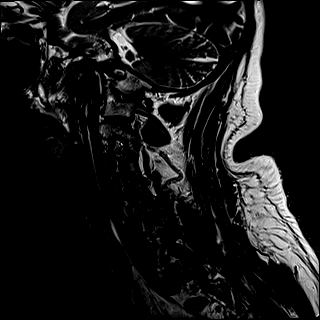
[im 6/15]
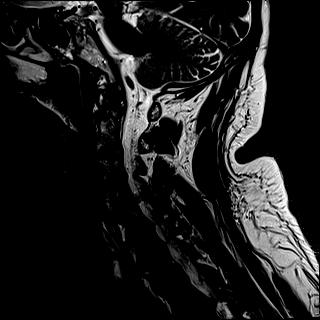
[im 9/15]
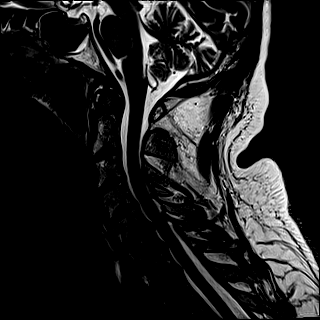
[im 12/15]
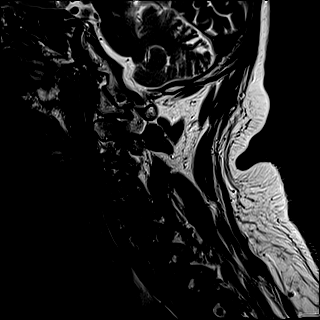
[im 15/15]
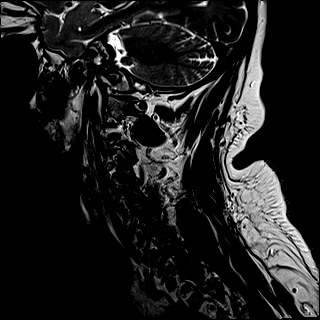

[Series 6: FLAIR · sagittal · 3.0mm · 0.78mm/px · 7 of 15 slices shown]
[im 1/15]
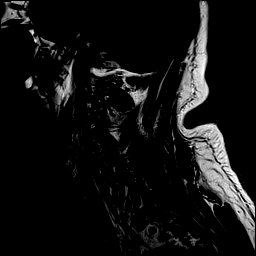
[im 3/15]
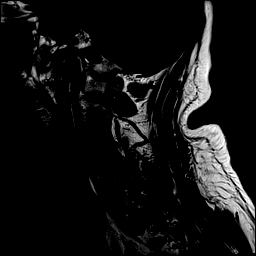
[im 5/15]
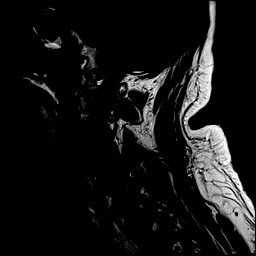
[im 8/15]
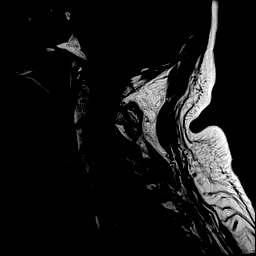
[im 10/15]
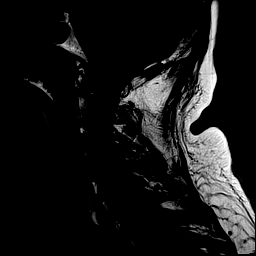
[im 12/15]
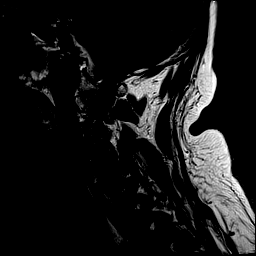
[im 15/15]
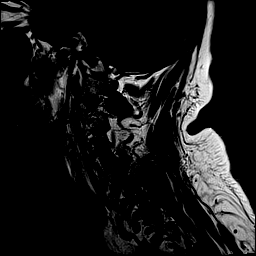

[Series 7: STIR · sagittal · 3.0mm · 0.62mm/px · 7 of 15 slices shown]
[im 1/15]
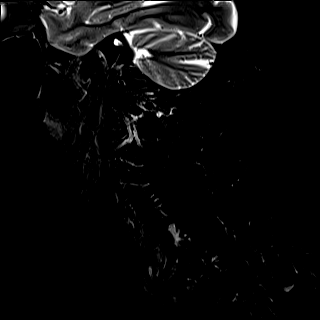
[im 3/15]
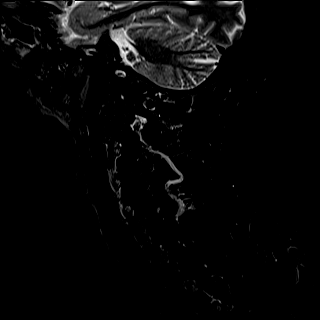
[im 5/15]
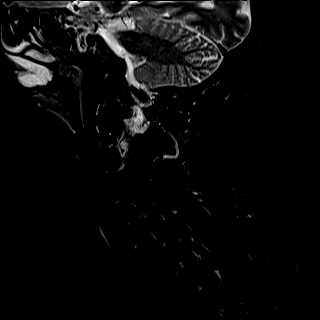
[im 8/15]
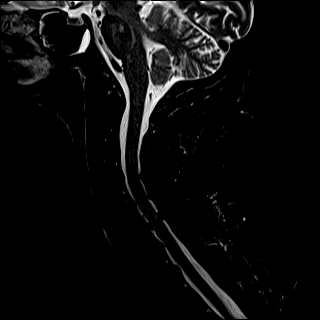
[im 10/15]
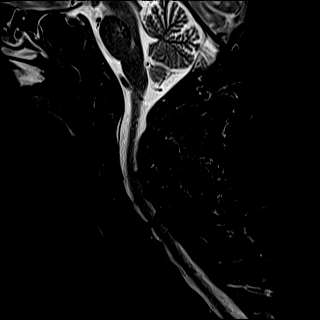
[im 12/15]
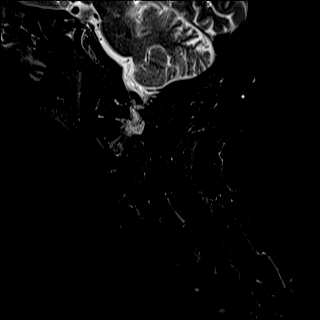
[im 15/15]
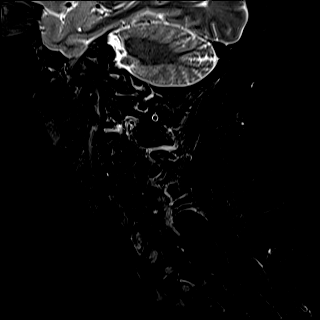

[Series 8: T2 · axial · 3.0mm · 0.70mm/px · z∈[-131,-38]mm · 11 of 30 slices shown (2 of 2)]
[im 1/30]
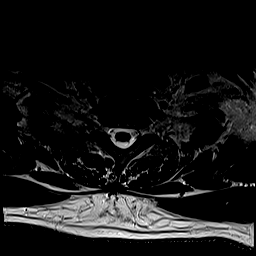
[im 3/30]
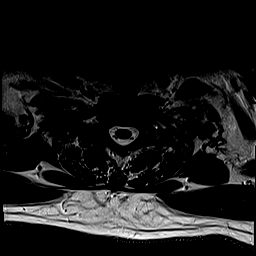
[im 5/30]
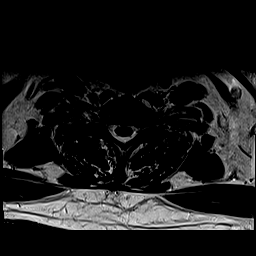
[im 7/30]
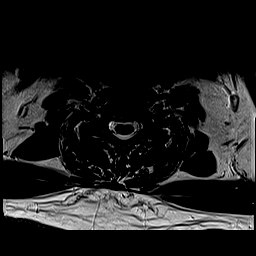
[im 9/30]
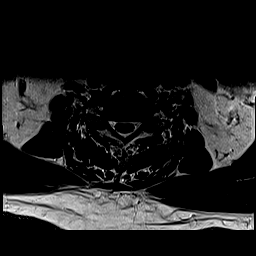
[im 12/30]
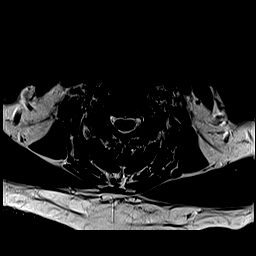
[im 14/30]
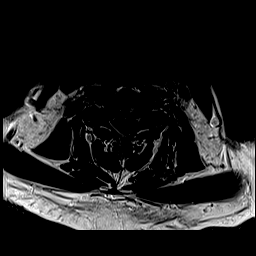
[im 16/30]
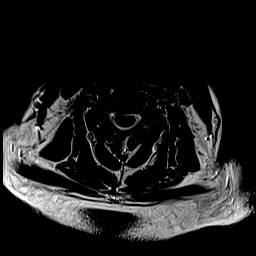
[im 21/30]
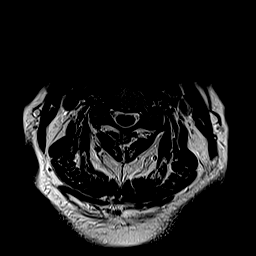
[im 25/30]
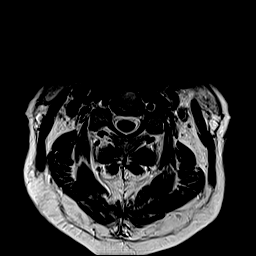
[im 30/30]
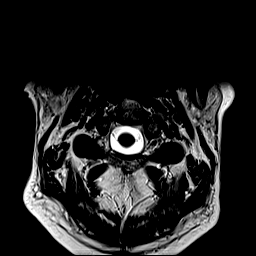

[Series 9: ax mpgr · axial · 3.0mm · 0.35mm/px · z∈[-131,-38]mm · 8 of 30 slices shown]
[im 1/30]
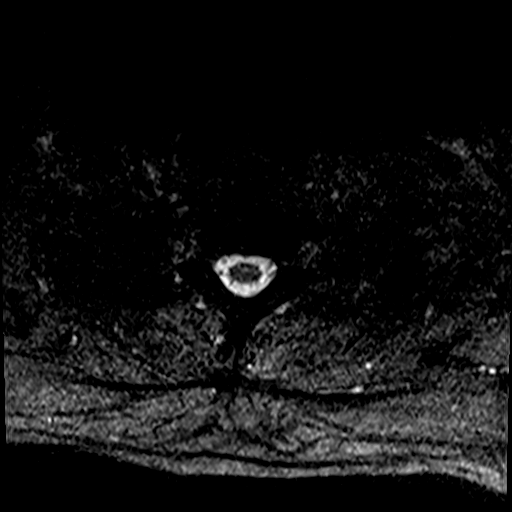
[im 5/30]
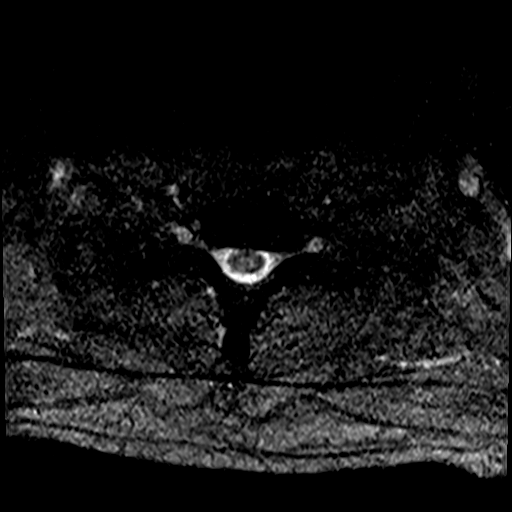
[im 9/30]
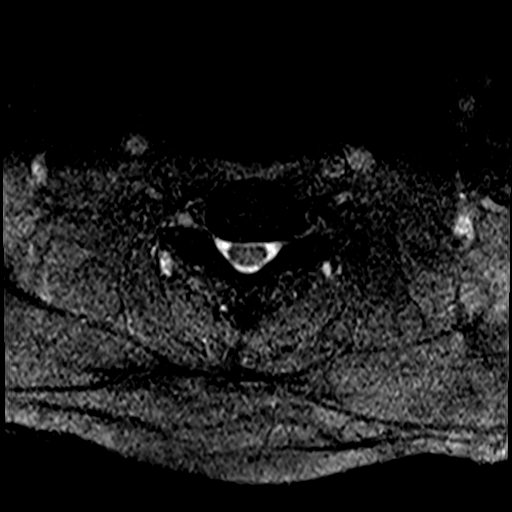
[im 14/30]
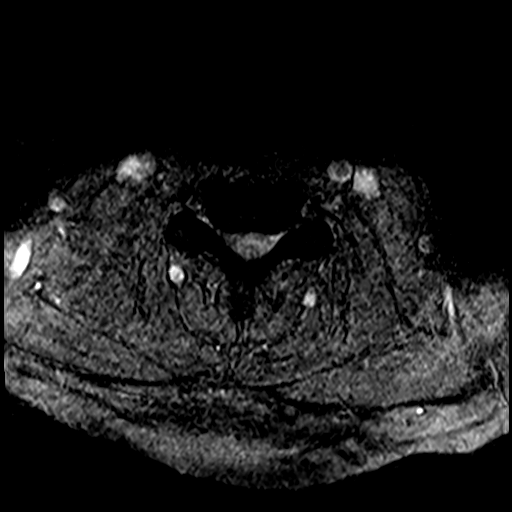
[im 16/30]
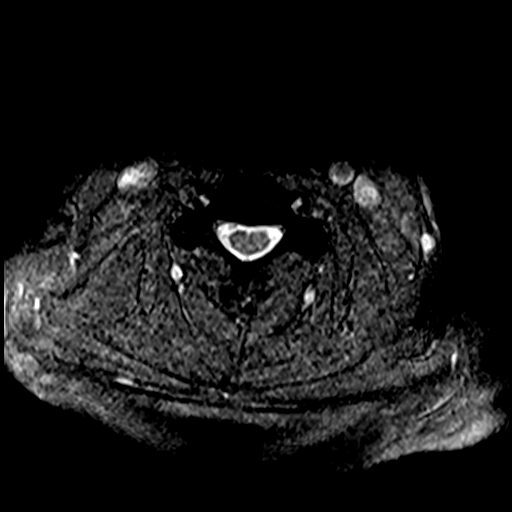
[im 21/30]
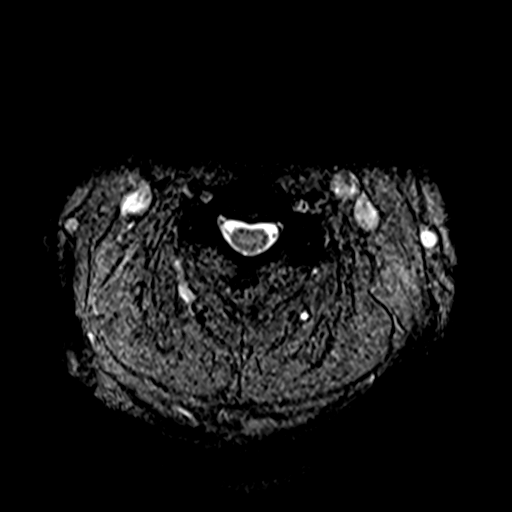
[im 25/30]
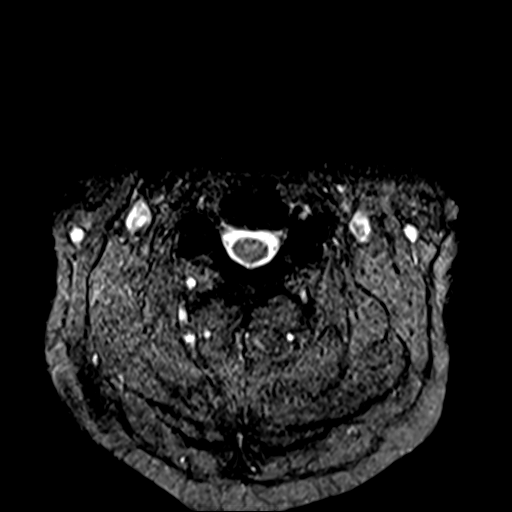
[im 30/30]
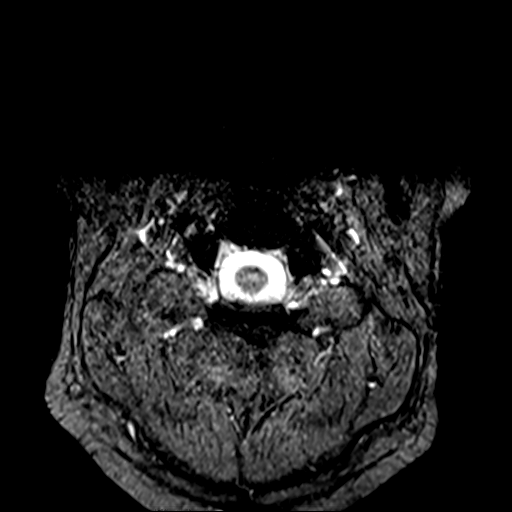

[39 of 48 positions shown; findings below may reference images not displayed]

FINDINGS: Alignment: Normal

Vertebrae: Normal bone marrow.  Negative for fracture or mass.

Cord: Normal signal and morphology

Posterior Fossa, vertebral arteries, paraspinal tissues: Negative

Disc levels:

C2-3: Mild disc degeneration.  Negative for stenosis

C3-4: Mild disc degeneration and spurring and bilateral facet
degeneration. Moderate left foraminal stenosis and mild right
foraminal stenosis

C4-5: Mild disc degeneration and spurring and bilateral facet
degeneration. Mild foraminal narrowing bilaterally. Small central
disc protrusion touching the cord.

C5-6: Disc degeneration with prominent diffuse uncinate spurring.
Moderate to severe foraminal encroachment bilaterally. Mild to
moderate spinal stenosis

C6-7: Disc degeneration and diffuse uncinate spurring. Moderate left
foraminal narrowing and mild to moderate right foraminal narrowing.

C7-T1: Moderate left foraminal narrowing. Possible left foraminal
disc protrusion and or spurring contributing to stenosis. Right
foramen patent.
IMPRESSION: Multilevel disc and facet degeneration throughout the cervical spine
causing spinal and foraminal stenosis bilaterally

Note is made of left foraminal encroachment at C7-T1. This may be
due to disc protrusion and or spurring.
